# Patient Record
Sex: Female | Born: 1960 | Race: White | Hispanic: No | Marital: Married | Smoking: Never smoker
Health system: Southern US, Community
[De-identification: ages and names within clinical notes are randomized; demographics above are authoritative.]

## PROBLEM LIST (undated history)

## (undated) DIAGNOSIS — I10 Essential (primary) hypertension: Secondary | ICD-10-CM

## (undated) DIAGNOSIS — T4145XA Adverse effect of unspecified anesthetic, initial encounter: Secondary | ICD-10-CM

## (undated) DIAGNOSIS — N809 Endometriosis, unspecified: Secondary | ICD-10-CM

## (undated) DIAGNOSIS — R569 Unspecified convulsions: Secondary | ICD-10-CM

## (undated) DIAGNOSIS — G43909 Migraine, unspecified, not intractable, without status migrainosus: Secondary | ICD-10-CM

## (undated) DIAGNOSIS — T8859XA Other complications of anesthesia, initial encounter: Secondary | ICD-10-CM

## (undated) DIAGNOSIS — J329 Chronic sinusitis, unspecified: Secondary | ICD-10-CM

## (undated) HISTORY — DX: Migraine, unspecified, not intractable, without status migrainosus: G43.909

## (undated) HISTORY — PX: FOOT SURGERY: SHX648

## (undated) HISTORY — DX: Endometriosis, unspecified: N80.9

## (undated) HISTORY — PX: BACK SURGERY: SHX140

## (undated) HISTORY — PX: COLONOSCOPY: SHX174

## (undated) HISTORY — DX: Essential (primary) hypertension: I10

---

## 1986-04-02 HISTORY — PX: TUBAL LIGATION: SHX77

## 2000-04-02 HISTORY — PX: BREAST BIOPSY: SHX20

## 2001-04-02 HISTORY — PX: NASAL FRACTURE SURGERY: SHX718

## 2001-04-02 HISTORY — PX: EYE SURGERY: SHX253

## 2004-04-02 HISTORY — PX: VAGINAL HYSTERECTOMY: SUR661

## 2010-06-01 DIAGNOSIS — G43909 Migraine, unspecified, not intractable, without status migrainosus: Secondary | ICD-10-CM | POA: Insufficient documentation

## 2011-03-02 DIAGNOSIS — J329 Chronic sinusitis, unspecified: Secondary | ICD-10-CM | POA: Insufficient documentation

## 2011-05-14 ENCOUNTER — Other Ambulatory Visit: Payer: Self-pay | Admitting: Gastroenterology

## 2011-05-14 DIAGNOSIS — D134 Benign neoplasm of liver: Secondary | ICD-10-CM

## 2011-05-18 ENCOUNTER — Other Ambulatory Visit: Payer: Self-pay

## 2011-05-28 ENCOUNTER — Ambulatory Visit
Admission: RE | Admit: 2011-05-28 | Discharge: 2011-05-28 | Disposition: A | Discharge status: Home / Self Care | Payer: BC Managed Care – PPO | Source: Ambulatory Visit | Attending: Gastroenterology | Admitting: Gastroenterology

## 2011-05-28 DIAGNOSIS — D134 Benign neoplasm of liver: Secondary | ICD-10-CM

## 2011-05-28 MED ORDER — IOHEXOL 300 MG/ML  SOLN
100.0000 mL | Freq: Once | INTRAMUSCULAR | Status: AC | PRN
  Administered 2011-05-28: 100 mL via INTRAVENOUS

## 2011-06-19 DIAGNOSIS — M810 Age-related osteoporosis without current pathological fracture: Secondary | ICD-10-CM | POA: Insufficient documentation

## 2012-06-10 DIAGNOSIS — I10 Essential (primary) hypertension: Secondary | ICD-10-CM | POA: Insufficient documentation

## 2012-06-10 DIAGNOSIS — J309 Allergic rhinitis, unspecified: Secondary | ICD-10-CM | POA: Insufficient documentation

## 2012-06-10 DIAGNOSIS — R232 Flushing: Secondary | ICD-10-CM | POA: Insufficient documentation

## 2012-07-08 DIAGNOSIS — Z8639 Personal history of other endocrine, nutritional and metabolic disease: Secondary | ICD-10-CM | POA: Insufficient documentation

## 2013-04-27 ENCOUNTER — Encounter: Payer: Self-pay | Admitting: Certified Nurse Midwife

## 2013-04-29 ENCOUNTER — Encounter: Payer: Self-pay | Admitting: Certified Nurse Midwife

## 2013-04-29 ENCOUNTER — Ambulatory Visit (INDEPENDENT_AMBULATORY_CARE_PROVIDER_SITE_OTHER): Payer: PRIVATE HEALTH INSURANCE | Admitting: Certified Nurse Midwife

## 2013-04-29 VITALS — BP 122/82 | HR 74 | Resp 20 | Ht 65.75 in | Wt 160.0 lb

## 2013-04-29 DIAGNOSIS — N8111 Cystocele, midline: Secondary | ICD-10-CM

## 2013-04-29 DIAGNOSIS — E041 Nontoxic single thyroid nodule: Secondary | ICD-10-CM

## 2013-04-29 DIAGNOSIS — Z01419 Encounter for gynecological examination (general) (routine) without abnormal findings: Secondary | ICD-10-CM

## 2013-04-29 DIAGNOSIS — IMO0001 Reserved for inherently not codable concepts without codable children: Secondary | ICD-10-CM

## 2013-04-29 DIAGNOSIS — Z0000 Encounter for general adult medical examination without abnormal findings: Secondary | ICD-10-CM

## 2013-04-29 DIAGNOSIS — IMO0002 Reserved for concepts with insufficient information to code with codable children: Secondary | ICD-10-CM

## 2013-04-29 LAB — POCT URINALYSIS DIPSTICK
Bilirubin, UA: NEGATIVE
Blood, UA: NEGATIVE
GLUCOSE UA: NEGATIVE
Ketones, UA: NEGATIVE
Leukocytes, UA: NEGATIVE
NITRITE UA: NEGATIVE
Protein, UA: NEGATIVE
UROBILINOGEN UA: NEGATIVE
pH, UA: 5

## 2013-04-29 LAB — HEMOGLOBIN, FINGERSTICK: HEMOGLOBIN, FINGERSTICK: 13.9 g/dL (ref 12.0–16.0)
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
## 2013-04-29 NOTE — Patient Instructions (Signed)

## 2013-04-29 NOTE — Progress Notes (Signed)
Reviewed personally.  M. Suzanne Addi Pak, MD.  

## 2013-04-29 NOTE — Progress Notes (Signed)
Patient scheduled for Thyroid U/S at [place] for 05/04/13 at 2:30. Patient aware. Agreeable to time/date/location. Imaging hold placed.

## 2013-04-30 ENCOUNTER — Telehealth: Payer: Self-pay | Admitting: Emergency Medicine

## 2013-04-30 LAB — THYROID PANEL WITH TSH
Free Thyroxine Index: 2.7 (ref 1.0–3.9)
T3 Uptake: 30.5 % (ref 22.5–37.0)
T4, Total: 8.8 ug/dL (ref 5.0–12.5)
TSH: 0.763 u[IU]/mL (ref 0.350–4.500)

## 2013-04-30 NOTE — Telephone Encounter (Signed)
Patient aware of Thryoid u/s Appointment scheduled for 1/30 at 1:45 at [facility].

## 2013-05-01 ENCOUNTER — Ambulatory Visit
Admission: RE | Admit: 2013-05-01 | Discharge: 2013-05-01 | Disposition: A | Discharge status: Home / Self Care | Payer: PRIVATE HEALTH INSURANCE | Source: Ambulatory Visit | Attending: Certified Nurse Midwife | Admitting: Certified Nurse Midwife

## 2013-05-01 ENCOUNTER — Other Ambulatory Visit: Payer: Self-pay | Admitting: Certified Nurse Midwife

## 2013-05-01 DIAGNOSIS — E041 Nontoxic single thyroid nodule: Secondary | ICD-10-CM

## 2013-05-04 ENCOUNTER — Encounter: Payer: Self-pay | Admitting: Certified Nurse Midwife

## 2013-05-05 ENCOUNTER — Telehealth: Payer: Self-pay | Admitting: Emergency Medicine

## 2013-05-05 DIAGNOSIS — E041 Nontoxic single thyroid nodule: Secondary | ICD-10-CM

## 2013-05-05 NOTE — Telephone Encounter (Signed)
Patient returning Tracy's call. °

## 2013-05-05 NOTE — Telephone Encounter (Signed)
Spoke with patient and message from Regina Eck CNM given. Patient agreeable to plan for follow up. Advised referral pending and would contact her when received appointment information.  Patient agreeable.

## 2013-05-05 NOTE — Telephone Encounter (Signed)
Message copied by Michele Mcalpine on Tue May 05, 2013  2:09 PM ------      Message from: Regina Eck      Created: Fri May 01, 2013  5:15 PM       Notify patient that thyroid US showed multiple small and the large nodules felt in the office.      Per finding recommendation is to be seen by Endocrinologist who specialize in this. Patient may need biopsy.      Please refer to Endocrine ------

## 2013-05-06 NOTE — Telephone Encounter (Signed)
Returning a call to Tracy °

## 2013-05-06 NOTE — Telephone Encounter (Addendum)
Given appointment for Dr. Buddy Duty for 08/03/13 at 2:00. Send message to Regina Eck CNM to confirm that this will be okay, patient will need earlier appointment. New referral entered for endocrinology at [facility]. Will attempt to get earlier appointment.  Advised patient of above and advised new referral placed. Will call her with earlier appointment.

## 2013-05-08 ENCOUNTER — Other Ambulatory Visit (HOSPITAL_COMMUNITY)
Admission: RE | Admit: 2013-05-08 | Discharge: 2013-05-08 | Disposition: A | Discharge status: Home / Self Care | Payer: PRIVATE HEALTH INSURANCE | Source: Ambulatory Visit | Attending: Endocrinology | Admitting: Endocrinology

## 2013-05-08 ENCOUNTER — Ambulatory Visit (INDEPENDENT_AMBULATORY_CARE_PROVIDER_SITE_OTHER): Payer: PRIVATE HEALTH INSURANCE | Admitting: Endocrinology

## 2013-05-08 ENCOUNTER — Encounter: Payer: Self-pay | Admitting: Endocrinology

## 2013-05-08 VITALS — BP 126/90 | HR 82 | Temp 98.0°F | Ht 65.75 in | Wt 164.0 lb

## 2013-05-08 DIAGNOSIS — E041 Nontoxic single thyroid nodule: Secondary | ICD-10-CM | POA: Insufficient documentation

## 2013-05-08 DIAGNOSIS — E042 Nontoxic multinodular goiter: Secondary | ICD-10-CM

## 2013-05-08 NOTE — Patient Instructions (Signed)
i'll notify you of the biopsy results via mychart. If no cancer is seen, please come back for a follow-up appointment in 6-12 months
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
## 2013-05-11 NOTE — Telephone Encounter (Signed)
Thank you :)

## 2013-05-11 NOTE — Telephone Encounter (Signed)
Called and cancelled appointment with Dr. Buddy Duty with [facility] at this time.  Patient has been seen at [facility].

## 2013-05-25 DIAGNOSIS — E041 Nontoxic single thyroid nodule: Secondary | ICD-10-CM | POA: Insufficient documentation

## 2014-02-01 ENCOUNTER — Encounter: Payer: Self-pay | Admitting: Endocrinology

## 2014-03-31 ENCOUNTER — Encounter: Payer: Self-pay | Admitting: Certified Nurse Midwife

## 2014-05-05 ENCOUNTER — Ambulatory Visit (INDEPENDENT_AMBULATORY_CARE_PROVIDER_SITE_OTHER): Payer: PRIVATE HEALTH INSURANCE | Admitting: Certified Nurse Midwife

## 2014-05-05 ENCOUNTER — Encounter: Payer: Self-pay | Admitting: Certified Nurse Midwife

## 2014-05-05 VITALS — BP 110/70 | HR 70 | Resp 16 | Ht 65.5 in | Wt 145.0 lb

## 2014-05-05 DIAGNOSIS — Z0000 Encounter for general adult medical examination without abnormal findings: Secondary | ICD-10-CM

## 2014-05-05 DIAGNOSIS — Z01419 Encounter for gynecological examination (general) (routine) without abnormal findings: Secondary | ICD-10-CM

## 2014-05-05 DIAGNOSIS — IMO0001 Reserved for inherently not codable concepts without codable children: Secondary | ICD-10-CM

## 2014-05-05 DIAGNOSIS — IMO0002 Reserved for concepts with insufficient information to code with codable children: Secondary | ICD-10-CM

## 2014-05-05 DIAGNOSIS — N8110 Cystocele, unspecified: Secondary | ICD-10-CM

## 2014-05-05 LAB — POCT URINALYSIS DIPSTICK
BILIRUBIN UA: NEGATIVE
Blood, UA: NEGATIVE
Glucose, UA: NEGATIVE
Ketones, UA: NEGATIVE
Leukocytes, UA: NEGATIVE
Nitrite, UA: NEGATIVE
PH UA: 8
PROTEIN UA: NEGATIVE
Urobilinogen, UA: NEGATIVE

## 2014-05-05 NOTE — Progress Notes (Signed)
Reviewed personally.  M. Suzanne Nirvan Laban, MD.  
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
## 2014-05-05 NOTE — Patient Instructions (Addendum)
EXERCISE AND DIET:  We recommended that you start or continue a regular exercise program for good health. Regular exercise means any activity that makes your heart beat faster and makes you sweat.  We recommend exercising at least 30 minutes per day at least 3 days a week, preferably 4 or 5.  We also recommend a diet low in fat and sugar.  Inactivity, poor dietary choices and obesity can cause diabetes, heart attack, stroke, and kidney damage, among others.    ALCOHOL AND SMOKING:  Women should limit their alcohol intake to no more than 7 drinks/beers/glasses of wine (combined, not each!) per week. Moderation of alcohol intake to this level decreases your risk of breast cancer and liver damage. And of course, no recreational drugs are part of a healthy lifestyle.  And absolutely no smoking or even second hand smoke. Most people know smoking can cause heart and lung diseases, but did you know it also contributes to weakening of your bones? Aging of your skin?  Yellowing of your teeth and nails?  CALCIUM AND VITAMIN D:  Adequate intake of calcium and Vitamin D are recommended.  The recommendations for exact amounts of these supplements seem to change often, but generally speaking 600 mg of calcium (either carbonate or citrate) and 800 units of Vitamin D per day seems prudent. Certain women may benefit from higher intake of Vitamin D.  If you are among these women, your doctor will have told you during your visit.    PAP SMEARS:  Pap smears, to check for cervical cancer or precancers,  have traditionally been done yearly, although recent scientific advances have shown that most women can have pap smears less often.  However, every woman still should have a physical exam from her gynecologist every year. It will include a breast check, inspection of the vulva and vagina to check for abnormal growths or skin changes, a visual exam of the cervix, and then an exam to evaluate the size and shape of the uterus and  ovaries.  And after 56 years of age, a rectal exam is indicated to check for rectal cancers. We will also provide age appropriate advice regarding health maintenance, like when you should have certain vaccines, screening for sexually transmitted diseases, bone density testing, colonoscopy, mammograms, etc.   MAMMOGRAMS:  All women over 40 years old should have a yearly mammogram. Many facilities now offer a "3D" mammogram, which may cost around $50 extra out of pocket. If possible,  we recommend you accept the option to have the 3D mammogram performed.  It both reduces the number of women who will be called back for extra views which then turn out to be normal, and it is better than the routine mammogram at detecting truly abnormal areas.    COLONOSCOPY:  Colonoscopy to screen for colon cancer is recommended for all women at age 50.  We know, you hate the idea of the prep.  We agree, BUT, having colon cancer and not knowing it is worse!!  Colon cancer so often starts as a polyp that can be seen and removed at colonscopy, which can quite literally save your life!  And if your first colonoscopy is normal and you have no family history of colon cancer, most women don't have to have it again for 10 years.  Once every ten years, you can do something that may end up saving your life, right?  We will be happy to help you get it scheduled when you are ready.    Be sure to check your insurance coverage so you understand how much it will cost.  It may be covered as a preventative service at no cost, but you should check your particular policy.     About Cystocele  Overview  The pelvic organs, including the bladder, are normally supported by pelvic floor muscles and ligaments.  When these muscles and ligaments are stretched, weakened or torn, the wall between the bladder and the vagina sags or herniates causing a prolapse, sometimes called a cystocele.  This condition may cause discomfort and problems with emptying  the bladder.  It can be present in various stages.  Some people are not aware of the changes.  Others may notice changes at the vaginal opening or a feeling of the bladder dropping outside the body.  Causes of a Cystocele  A cystocele is usually caused by muscle straining or stretching during childbirth.  In addition, cystocele is more common after menopause, because the hormone estrogen helps keep the elastic tissues around the pelvic organs strong.  A cystocele is more likely to occur when levels of estrogen decrease.  Other causes include: heavy lifting, chronic coughing, previous pelvic surgery and obesity.  Symptoms  A bladder that has dropped from its normal position may cause: unwanted urine leakage (stress incontinence), frequent urination or urge to urinate, incomplete emptying of the bladder (not feeling bladder relief after emptying), pain or discomfort in the vagina, pelvis, groin, lower back or lower abdomen and frequent urinary tract infections.  Mild cases may not cause any symptoms.  Treatment Options  Pelvic floor (Kegel) exercises:  Strength training the muscles in your genital area  Behavioral changes: Treating and preventing constipation, taking time to empty your bladder properly, learning to lift properly and/or avoid heavy lifting when possible, stopping smoking, avoiding weight gain and treating a chronic cough or bronchitis.  A pessary: A vaginal support device is sometimes used to help pelvic support caused by muscle and ligament changes.  Surgery: Surgical repair may be necessary if symptoms cannot be managed with exercise, behavioral changes and a pessary.  Surgery is usually considered for severe cases.   2007, [employer] 

## 2014-09-20 ENCOUNTER — Encounter: Payer: Self-pay | Admitting: Podiatry

## 2014-09-20 ENCOUNTER — Ambulatory Visit (INDEPENDENT_AMBULATORY_CARE_PROVIDER_SITE_OTHER): Payer: PRIVATE HEALTH INSURANCE | Admitting: Podiatry

## 2014-09-20 ENCOUNTER — Ambulatory Visit: Payer: PRIVATE HEALTH INSURANCE

## 2014-09-20 VITALS — BP 149/96 | HR 74 | Resp 12

## 2014-09-20 DIAGNOSIS — M722 Plantar fascial fibromatosis: Secondary | ICD-10-CM | POA: Diagnosis not present

## 2014-09-20 DIAGNOSIS — R52 Pain, unspecified: Secondary | ICD-10-CM | POA: Diagnosis not present

## 2014-09-20 DIAGNOSIS — M629 Disorder of muscle, unspecified: Secondary | ICD-10-CM | POA: Diagnosis not present

## 2014-09-20 NOTE — Progress Notes (Signed)
   Subjective:    Patient ID: Sarah Fowler, female    DOB: 09/06/1960, 54 y.o.   MRN: [id number]  HPI 20 shell female presents the office today with complaints of severe right heel pain which has been ongoing for several months. She states that she appears to be treated for plantar fasciitis and she's had 3 steroid injections to the area which did not help. She is also been wearing a day/night splint which do not seem to help either. She states that she has pain which she puts pressure to her heel. She states that she has pain to her heel throughout the day. When the pain first started seeing that it was more in the morning however his progressed. She also states that she previously she had a fracture to her heel. She denies any numbness or tingling. Denies any swelling or redness. The pain does not wake her up at night. No other complaints this time.  Review of Systems  HENT: Positive for sinus pressure.   Musculoskeletal: Positive for myalgias.       Objective:   Physical Exam AAO x3, NAD DP/PT pulses palpable bilaterally, CRT less than 3 seconds Protective sensation intact with Simms Weinstein monofilament, vibratory sensation intact, Achilles tendon reflex intact Tenderness to palpation overlying the plantar medial tubercle of the calcaneus to right heel at the insertion of the plantar fascia. There is pain along the course of plantar fascial within the arch of the foot. Upon dorsiflexion of the hallux the plantar fascial does not appear to be as defined as the contralateral extremity. There is no pain with lateral compression of the calcaneus or pain the vibratory sensation. No pain on the posterior aspect of the calcaneus or along the course/insertion of the Achilles tendon. There is no overlying edema, erythema, increase in warmth. No other areas of tenderness palpation or pain with vibratory sensation to the foot/ankle. MMT 5/5, ROM WNL No open lesions or pre-ulcerative lesions are  identified. No pain with calf compression, swelling, warmth, erythema.     Assessment & Plan:  54 year-old female with significant right heel pain -X-rays were obtained and reviewed with the patient.  -Treatment options discussed including all alternatives, risks, and complications -As the patient has attempted multiple conservative treatments including injections, ice, bracing, shoe changes, orthotics and as the plantar fascia does not appear to be ossifying this compared to contralateral extremity will obtain MRI to further evaluate the plantar fascial in the integrity of the ligament. Also she states that she has a history of a fracture to the heel although this is not evident on x-ray. -Discussed surgical intervention and possible physical therapy however we'll await the results of the MRI before proceeding. -For now continue with ice, should shoe gear modifications, bracing. -Follow-up after MRI or sooner if any problems are to arise. In the meantime call the office with any questions, concerns, changes symptoms.

## 2014-09-21 ENCOUNTER — Telehealth: Payer: Self-pay | Admitting: *Deleted

## 2014-09-21 NOTE — Telephone Encounter (Signed)
Spoke with Sarah Fowler 09/21/2014 at [facility] stated no prior authorization was needed for outpt MRI 83475 right foot without contrast.  Contacted pt at work phone and informed of pt of insurance information.  Pt states she is scheduled for 09/22/2014 and Dr. Jacqualyn Posey stated he wanted to see her after the MRI was performed.  Transferred pt to schedulers.

## 2014-09-22 ENCOUNTER — Ambulatory Visit
Admission: RE | Admit: 2014-09-22 | Discharge: 2014-09-22 | Disposition: A | Discharge status: Home / Self Care | Payer: 59 | Source: Ambulatory Visit | Attending: Podiatry | Admitting: Podiatry

## 2014-09-22 DIAGNOSIS — M629 Disorder of muscle, unspecified: Secondary | ICD-10-CM

## 2014-10-01 ENCOUNTER — Ambulatory Visit (INDEPENDENT_AMBULATORY_CARE_PROVIDER_SITE_OTHER): Payer: PRIVATE HEALTH INSURANCE | Admitting: Podiatry

## 2014-10-01 ENCOUNTER — Encounter: Payer: Self-pay | Admitting: Podiatry

## 2014-10-01 VITALS — BP 143/85 | HR 68 | Resp 14

## 2014-10-01 DIAGNOSIS — M722 Plantar fascial fibromatosis: Secondary | ICD-10-CM | POA: Diagnosis not present

## 2014-10-01 NOTE — Patient Instructions (Signed)
Pre-Operative Instructions  Congratulations, you have decided to take an important step to improving your quality of life.  You can be assured that the doctors of [facility] will be with you every step of the way.  1. Plan to be at the surgery center/hospital at least 1 (one) hour prior to your scheduled time unless otherwise directed by the surgical center/hospital staff.  You must have a responsible adult accompany you, remain during the surgery and drive you home.  Make sure you have directions to the surgical center/hospital and know how to get there on time. 2. For hospital based surgery you will need to obtain a history and physical form from your family physician within 1 month prior to the date of surgery- we will give you a form for you primary physician.  3. We make every effort to accommodate the date you request for surgery.  There are however, times where surgery dates or times have to be moved.  We will contact you as soon as possible if a change in schedule is required.   4. No Aspirin/Ibuprofen for one week before surgery.  If you are on aspirin, any non-steroidal anti-inflammatory medications (Mobic, Aleve, Ibuprofen) you should stop taking it 7 days prior to your surgery.  You make take Tylenol  For pain prior to surgery.  5. Medications- If you are taking daily heart and blood pressure medications, seizure, reflux, allergy, asthma, anxiety, pain or diabetes medications, make sure the surgery center/hospital is aware before the day of surgery so they may notify you which medications to take or avoid the day of surgery. 6. No food or drink after midnight the night before surgery unless directed otherwise by surgical center/hospital staff. 7. No alcoholic beverages 24 hours prior to surgery.  No smoking 24 hours prior to or 24 hours after surgery. 8. Wear loose pants or shorts- loose enough to fit over bandages, boots, and casts. 9. No slip on shoes, sneakers are best. 10. Bring  your boot with you to the surgery center/hospital.  Also bring crutches or a walker if your physician has prescribed it for you.  If you do not have this equipment, it will be provided for you after surgery. 11. If you have not been contracted by the surgery center/hospital by the day before your surgery, call to confirm the date and time of your surgery. 12. Leave-time from work may vary depending on the type of surgery you have.  Appropriate arrangements should be made prior to surgery with your employer. 13. Prescriptions will be provided immediately following surgery by your doctor.  Have these filled as soon as possible after surgery and take the medication as directed. 14. Remove nail polish on the operative foot. 15. Wash the night before surgery.  The night before surgery wash the foot and leg well with the antibacterial soap provided and water paying special attention to beneath the toenails and in between the toes.  Rinse thoroughly with water and dry well with a towel.  Perform this wash unless told not to do so by your physician.  Enclosed: 1 Ice pack (please put in freezer the night before surgery)   1 Hibiclens skin cleaner   Pre-op Instructions  If you have any questions regarding the instructions, do not hesitate to call our office.  [place]: [address] [place], [place] [address] [phone]  [place]: [address] [place], [place] [address] [phone]  [place]: [address]  [place], [place] [address] [phone]  Dr. Richard   Tuchman DPM, Dr. Norman Regal DPM Dr. Richard Sikora DPM, Dr. M. Todd Hyatt DPM, Dr. Kathryn Egerton DPM, Dr. Dorean Daniello DPM 

## 2014-10-05 ENCOUNTER — Telehealth: Payer: Self-pay | Admitting: *Deleted

## 2014-10-05 NOTE — Telephone Encounter (Signed)
I called patient to see if she could move her surgery from 10/12/2014 to October 19, 2104.  Dr. Leigh Aurora schedule is already full for 10/12/2014.  "That will be fine.  It'll give me another week to work.  What time will it be?"  Surgical center will call you a day or 2 prior to your surgery date and give you that information.  "Okay, thank you."

## 2014-10-07 DIAGNOSIS — M79673 Pain in unspecified foot: Secondary | ICD-10-CM

## 2014-10-10 NOTE — Progress Notes (Signed)
Patient ID: Sarah Fowler, female   DOB: 07/02/1960, 54 y.o.   MRN: [id number]  Subjective: 54 year old female presents the office they for follow-up evaluation of right heel pain and to discuss MRI results. She states that she continues to have pain to her right heel in the mornings or after periods of activity which is on her feet for long periods of time. She denies any numbness or tingling. The pain does not wake her up at night. She denies any history of injury or trauma. Denies any swelling or redness. She's had muscle conservative treatments including shoe gear modifications, inserts, steroid injections, anti-inflammatories without any relief of symptoms. At this time she is requesting surgical intervention to help decrease her pain. No other complaints at this time in no acute changes since last appointment.  Objective: AAO x3, NAD DP/PT pulses palpable bilaterally, CRT less than 3 seconds Protective sensation intact with Simms Weinstein monofilament, vibratory sensation intact, Achilles tendon reflex intact; negative tinel sign There is continued tenderness to palpation overlying the plantar medial tubercle of the calcaneus to right heel at the insertion of the plantar fascia. There is no pain along the course of plantar fascia within the arch of the foot. There is no pain with lateral compression of the calcaneus or pain the vibratory sensation. No pain on the posterior aspect of the calcaneus or along the course/insertion of the Achilles tendon. There is no overlying edema, erythema, increase in warmth. No other areas of tenderness palpation or pain with vibratory sensation to the foot/ankle. MMT 5/5, ROM WNL No open lesions or pre-ulcerative lesions are identified. No pain with calf compression, swelling, warmth, erythema.  Assessment: 54 year old female with right foot plantar fasciitis.  Plan: -Treatment options discussed including all alternatives, risks, and complications -MRI results  were discussed the patient -At this time as she is attended conservative treatment without any relief of symptoms I discussed with her surgical intervention physical therapy. At this time she is requesting surgical intervention. I discussed with her right foot plantar fasciotomy with heel spur resection and PRP. -The incision placement as well as the postoperative course was discussed with the patient. I discussed risks of the surgery which include, but not limited to, infection, bleeding, pain, swelling, need for further surgery, delayed or nonhealing, painful or ugly scar, numbness or sensation changes, over/under correction, recurrence, transfer lesions, further deformity, hardware failure, DVT/PE, loss of toe/foot. Patient understands these risks and wishes to proceed with surgery. The surgical consent was reviewed with the patient all 3 pages were signed. No promises or guarantees were given to the outcome of the procedure. All questions were answered to the best of my ability. Before the surgery the patient was encouraged to call the office if there is any further questions. The surgery will be performed at the [facility] on an outpatient basis.  Celesta Gentile, DPM

## 2014-10-20 ENCOUNTER — Encounter: Payer: Self-pay | Admitting: Podiatry

## 2014-10-20 DIAGNOSIS — M7730 Calcaneal spur, unspecified foot: Secondary | ICD-10-CM | POA: Diagnosis not present

## 2014-10-20 DIAGNOSIS — M722 Plantar fascial fibromatosis: Secondary | ICD-10-CM | POA: Diagnosis not present

## 2014-10-21 NOTE — Progress Notes (Signed)
DOS 10/20/2014 Right plantar fasciotomy, right heel spur resection (inferior) platelet rich plasma.

## 2014-10-21 NOTE — Progress Notes (Signed)
It is platelet RICH plasma.

## 2014-10-25 ENCOUNTER — Ambulatory Visit (INDEPENDENT_AMBULATORY_CARE_PROVIDER_SITE_OTHER): Payer: PRIVATE HEALTH INSURANCE | Admitting: Podiatry

## 2014-10-25 ENCOUNTER — Ambulatory Visit (INDEPENDENT_AMBULATORY_CARE_PROVIDER_SITE_OTHER): Payer: PRIVATE HEALTH INSURANCE

## 2014-10-25 ENCOUNTER — Encounter: Payer: Self-pay | Admitting: Podiatry

## 2014-10-25 VITALS — BP 142/81 | HR 82 | Resp 15

## 2014-10-25 DIAGNOSIS — Z9889 Other specified postprocedural states: Secondary | ICD-10-CM | POA: Diagnosis not present

## 2014-10-25 DIAGNOSIS — M722 Plantar fascial fibromatosis: Secondary | ICD-10-CM

## 2014-10-25 MED ORDER — HYDROCODONE-ACETAMINOPHEN 10-325 MG PO TABS: 1.0000 | ORAL_TABLET | ORAL | Status: DC | PRN

## 2014-10-25 NOTE — Progress Notes (Signed)
Patient ID: Sarah Fowler, female   DOB: 01-12-1961, 54 y.o.   MRN: [id number]  DOS: 10/20/14  Subjective: 54 year old female presents the office today one-week status post right foot heel spur resection, plantar fasciotomy, PRP injection. She states that overall she is doing well although she does continue to have pain which is relieved by the pain medication. She's been taking antibiotic as directed. She does of the Percocet is making her itch. She is continuing the cam walker and crutches. She states that she is tried to stay off as much as possible although she does get up on it and she did slip the other today. Denies any systemic complaints as fevers, chills, nausea, vomiting. Denies any calf pain, chest pain, since of breath. No other complaints at this time in no acute changes.  Objective: AAO 3, NAD DP/PT pulses palpable, CRT less than 3 seconds Neurological status intact. There is no subjective numbness or tingling. Incision the medial aspect of the barefoot as well coapted without any evidence of dehiscence and sutures are intact. There is no swelling erythema, ascending cellulitis, fluctuance, crepitus, drainage, malodor. No clinical signs of infection this time. There is tenderness to palpation overlying the surgical site with mild edema. No other areas of tenderness to bilateral lower extremity is. No other areas of edema, erythema, increase in warmth. No open lesions or pre-ulcerative lesions. No pain with calf compression, swelling, warmth, erythema.  Assessment: 54 year old female 1 week status post right foot surgery  Plan: -X-rays were obtained and reviewed with the patient.  -Treatment options discussed including all alternatives, risks, and complications -Antibiotic ointment was applied over the incision followed by dry sterile dressing. Keep dressing clean, dry, intact. -Weightbearing as tolerated in cam boot. -Ice/elevation -Pain medication as needed. Rx Vicodin  -Monitor  for any clinical signs or symptoms of infection and directed to call the office immediately should any occur or go to the ER. -Follow-up 1 week for suture removal or sooner if any problems arise. In the meantime, encouraged to call the office with any questions, concerns, change in symptoms.   Celesta Gentile, DPM

## 2014-11-01 ENCOUNTER — Encounter: Payer: Self-pay | Admitting: Podiatry

## 2014-11-01 ENCOUNTER — Ambulatory Visit (INDEPENDENT_AMBULATORY_CARE_PROVIDER_SITE_OTHER): Payer: PRIVATE HEALTH INSURANCE

## 2014-11-01 ENCOUNTER — Ambulatory Visit (INDEPENDENT_AMBULATORY_CARE_PROVIDER_SITE_OTHER): Payer: PRIVATE HEALTH INSURANCE | Admitting: Podiatry

## 2014-11-01 VITALS — BP 136/91 | HR 82 | Resp 12

## 2014-11-01 DIAGNOSIS — Z9889 Other specified postprocedural states: Secondary | ICD-10-CM

## 2014-11-01 DIAGNOSIS — M722 Plantar fascial fibromatosis: Secondary | ICD-10-CM

## 2014-11-01 NOTE — Progress Notes (Signed)
Patient ID: Kathlene Yano, female   DOB: 10-Jan-1961, 54 y.o.   MRN: [id number]  DOS: 10/20/14  Subjective: 54 year old female presents the office today 2-weeks status post right foot heel spur resection, plantar fasciotomy, PRP injection. She said that overall she is to monitor pain is better controlled. She has decreasing pain medicines she has been taking. She is continuing to wear the CAM walker. Denies any systemic complaints as fevers, chills, nausea, vomiting. Denies any calf pain, chest pain, since of breath. No other complaints at this time in no acute changes.  Objective: AAO 3, NAD DP/PT pulses palpable, CRT less than 3 seconds Neurological status intact. There is no subjective numbness or tingling. Incision the medial aspect of the barefoot as well coapted without any evidence of dehiscence and sutures are intact. There is no surrounding erythema, ascending cellulitis, fluctuance, crepitus, drainage, malodor. No clinical signs of infection this time. There is decreased tenderness to palpation overlying the surgical site with mild edema. Mild discomfort along the plantar aspect of the foot on the surgical site. No other areas of tenderness to bilateral lower extremities. No other areas of edema, erythema, increase in warmth. No open lesions or pre-ulcerative lesions. No pain with calf compression, swelling, warmth, erythema.  Assessment: 54 year old female 2 weeks status post right foot surgery  Plan: -Treatment options discussed including all alternatives, risks, and complications -Sutures removed without complications. Antibiotic ointment was applied over the incision followed by dry sterile dressing. Keep dressing clean, dry, intact. She did with a dressing 24 hours since her to shower as long the incision remains coapted. There is any problem the incision to hold off on showering call the office. -Weightbearing as tolerated in cam boot. She can transition to a regular sneaker as  tolerated. -She must wear the CAM walker with a night splint every night while sleeping. -Ice/elevation -Pain medication as needed.   -Monitor for any clinical signs or symptoms of infection and directed to call the office immediately should any occur or go to the ER. -Follow-up 2 weeks for suture removal or sooner if any problems arise. In the meantime, encouraged to call the office with any questions, concerns, change in symptoms.   Celesta Gentile, DPM

## 2014-11-08 ENCOUNTER — Telehealth: Payer: Self-pay | Admitting: Podiatry

## 2014-11-08 NOTE — Telephone Encounter (Signed)
PT CALLED AND STATES SHE HAS AN APPT ON 8.15 TO FOLLOW UP BUT HAS A HARD KNOT ON HEEL OF FOOT. SHOULD SHE COME IN SOONER?

## 2014-11-15 ENCOUNTER — Ambulatory Visit (INDEPENDENT_AMBULATORY_CARE_PROVIDER_SITE_OTHER): Payer: PRIVATE HEALTH INSURANCE | Admitting: Podiatry

## 2014-11-15 ENCOUNTER — Encounter: Payer: Self-pay | Admitting: Podiatry

## 2014-11-15 VITALS — BP 142/91 | HR 84 | Resp 18

## 2014-11-15 DIAGNOSIS — Z9889 Other specified postprocedural states: Secondary | ICD-10-CM

## 2014-11-15 DIAGNOSIS — M722 Plantar fascial fibromatosis: Secondary | ICD-10-CM

## 2014-11-15 MED ORDER — CEPHALEXIN 500 MG PO CAPS: 500.0000 mg | ORAL_CAPSULE | Freq: Three times a day (TID) | ORAL | Status: DC

## 2014-11-15 NOTE — Progress Notes (Signed)
Patient ID: Sarah Fowler, female   DOB: 18-Mar-1961, 54 y.o.   MRN: [id number]  DOS: 10/20/14 s/p plantar fascia release  Subjective: 54 year old female presents the office today status post right foot heel resection, plantar fasciotomy, PRP. She states that since last appointment she is felt to not perform on the bottom of her heel which is not present after the surgery. She also states that she notices small out of yellow drainage coming from the incision however she denies any pus. She states that she has pain to the bottom of her foot. She stated that she is unable to wear tennis shoe at this time as she remains in the cam walker. She did state that when he tried into the shoe she was wearing a hard orthotic however she did take orthotic out and wear regular insert which seem to help. She denies any systemic complaints as fevers, chills, nausea, vomiting. She denies any calf pain, chest pain, shortness of breath. No other complaints at this time in no acute changes his last appointment.  Objective: AAO 3, NAD Neurovascular status unchanged Incision the medial aspect of the right heels coapted without any evidence of dehiscence. There is a small amount of macerated tissue around the incision. There is no drainage or purulence expressed. There is a faint amount of erythema around the incision have there is no ascending cellulitis at this time. There is no areas of fluctuance or crepitus. There is no malodor. There is tenderness palpation over the medial aspect of the heel over surgical site. There is some subjective numbness between the first and second toes on the dorsal aspect of the foot. There is no subjective numbness or tingling to the plantar aspect of the foot. On the plantar aspect of the heel about surgical site there is a small firm area however there is no specific nodule identified. No other open lesions or pre-ulcerative lesions identified. There is no pain with calf compression, swelling,  warmth, erythema.  Assessment: 54 year old female status post right foot surgery  Plan: -Treatment options discussed including all alternatives, risks, and complications -Continued antibiotic ointment and a Band-Aid over the incision daily. -In case of infection we'll start Keflex. Monitor for any clinical signs or symptoms of infection and directed to call the office immediately should any occur or go to the ER. -Wear Cam Gilford Rile is needed. She can start to transition to a regular shoe as tolerated. Muscle or Cam Walker at night splint at night. -Pain medication as needed. -Compression stocking. -Follow-up 2 weeks or sooner if any problems arise. In the meantime, encouraged to call the office with any questions, concerns, change in symptoms.   Celesta Gentile, DPM

## 2014-11-29 ENCOUNTER — Encounter: Payer: Self-pay | Admitting: Podiatry

## 2014-11-29 ENCOUNTER — Ambulatory Visit (INDEPENDENT_AMBULATORY_CARE_PROVIDER_SITE_OTHER): Payer: PRIVATE HEALTH INSURANCE | Admitting: Podiatry

## 2014-11-29 VITALS — BP 139/82 | HR 95 | Resp 18

## 2014-11-29 DIAGNOSIS — Z9889 Other specified postprocedural states: Secondary | ICD-10-CM

## 2014-11-29 DIAGNOSIS — M722 Plantar fascial fibromatosis: Secondary | ICD-10-CM
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
## 2014-12-27 ENCOUNTER — Encounter: Payer: Self-pay | Admitting: Podiatry

## 2014-12-27 ENCOUNTER — Ambulatory Visit (INDEPENDENT_AMBULATORY_CARE_PROVIDER_SITE_OTHER): Payer: PRIVATE HEALTH INSURANCE | Admitting: Podiatry

## 2014-12-27 VITALS — BP 126/74 | HR 78 | Resp 18

## 2014-12-27 DIAGNOSIS — M722 Plantar fascial fibromatosis: Secondary | ICD-10-CM

## 2014-12-27 DIAGNOSIS — Z9889 Other specified postprocedural states: Secondary | ICD-10-CM

## 2014-12-27 NOTE — Progress Notes (Signed)
Patient ID: Sarah Fowler, female   DOB: [birth year], 54 y.o.   MRN: [id number]  DOS: 10/20/14 s/p plantar fascia release  Subjective: 54 year old female presents the office today status post right foot heel spur resection, plantar fasciotomy, PRP. She states that since last appointment she's had significant improvement in her pain. She gets some mild discomfort on the incision over the pain over the heels much improved. She gets some discomfort when it rains. She says that the knot has resolved. She's been wearing the cam boot or night splint at night. She is able to wear regular shoe without any palpitations. She denies any systemic complaints as fevers, chills, nausea, vomiting. She denies any calf pain, chest pain, shortness of breath. No other complaints at this time in no acute changes his last appointment.  Objective: AAO 3, NAD; presents in a regular shoe.  Neurovascular status unchanged Incision the medial aspect of the right heel remains coapted without any evidence of dehiscence. There is mild to palpation overlying the incision however does appear to be improved. There is trace edema around the surgical site without any associated erythema or increased warmth. There is mild discomfort to palpation along the surgical site on the medial incision however there is no pain on the plantar aspect of the heel on the plantar fascial surgery site. There is no pain on the course of the plantar fascia. No other open lesions or pre-ulcerative lesions identified.  There is no pain with calf compression, swelling, warmth, erythema.  Assessment: 54 year old female status post right foot surgery, doing well.   Plan: -Treatment options discussed including all alternatives, risks, and complications -Continue to use vitamin E cream or cocoa butter on the scar to help with the incision. -Continue ankle compression stocking -A to wear the cam boot at night splint at night every night she conservative back  off on this over the next couple weeks as long as she has no pain to her heel. -Continue supportive shoe gear. -Pain medication as needed -Follow-up  if symptoms are not completely resolved within the next 4 weeks sooner if any problems arise. In the meantime, encouraged to call the office with any questions, concerns, change in symptoms.   Celesta Gentile, DPM

## 2015-01-05 DIAGNOSIS — K635 Polyp of colon: Secondary | ICD-10-CM | POA: Insufficient documentation

## 2015-02-11 DIAGNOSIS — M479 Spondylosis, unspecified: Secondary | ICD-10-CM | POA: Insufficient documentation

## 2015-02-14 ENCOUNTER — Emergency Department (HOSPITAL_COMMUNITY): Payer: 59

## 2015-02-14 ENCOUNTER — Encounter (HOSPITAL_COMMUNITY): Payer: Self-pay

## 2015-02-14 ENCOUNTER — Emergency Department (HOSPITAL_COMMUNITY)
Admission: EM | Admit: 2015-02-14 | Discharge: 2015-02-14 | Disposition: A | Discharge status: Home / Self Care | Payer: 59 | Attending: Emergency Medicine | Admitting: Emergency Medicine

## 2015-02-14 DIAGNOSIS — Y999 Unspecified external cause status: Secondary | ICD-10-CM | POA: Insufficient documentation

## 2015-02-14 DIAGNOSIS — W01198A Fall on same level from slipping, tripping and stumbling with subsequent striking against other object, initial encounter: Secondary | ICD-10-CM | POA: Diagnosis not present

## 2015-02-14 DIAGNOSIS — S0990XA Unspecified injury of head, initial encounter: Secondary | ICD-10-CM | POA: Insufficient documentation

## 2015-02-14 DIAGNOSIS — Z79899 Other long term (current) drug therapy: Secondary | ICD-10-CM | POA: Insufficient documentation

## 2015-02-14 DIAGNOSIS — M79605 Pain in left leg: Secondary | ICD-10-CM | POA: Diagnosis not present

## 2015-02-14 DIAGNOSIS — Y92002 Bathroom of unspecified non-institutional (private) residence single-family (private) house as the place of occurrence of the external cause: Secondary | ICD-10-CM | POA: Insufficient documentation

## 2015-02-14 DIAGNOSIS — Y93E1 Activity, personal bathing and showering: Secondary | ICD-10-CM | POA: Insufficient documentation

## 2015-02-14 DIAGNOSIS — G43909 Migraine, unspecified, not intractable, without status migrainosus: Secondary | ICD-10-CM | POA: Insufficient documentation

## 2015-02-14 DIAGNOSIS — Z8742 Personal history of other diseases of the female genital tract: Secondary | ICD-10-CM | POA: Insufficient documentation

## 2015-02-14 DIAGNOSIS — I10 Essential (primary) hypertension: Secondary | ICD-10-CM | POA: Insufficient documentation

## 2015-02-14 DIAGNOSIS — Z792 Long term (current) use of antibiotics: Secondary | ICD-10-CM | POA: Insufficient documentation

## 2015-02-14 DIAGNOSIS — R55 Syncope and collapse: Secondary | ICD-10-CM | POA: Insufficient documentation

## 2015-02-14 LAB — I-STAT TROPONIN, ED: TROPONIN I, POC: 0.01 ng/mL (ref 0.00–0.08)

## 2015-02-14 LAB — BASIC METABOLIC PANEL
ANION GAP: 9 (ref 5–15)
BUN: 13 mg/dL (ref 6–20)
CALCIUM: 10.5 mg/dL — AB (ref 8.9–10.3)
CO2: 29 mmol/L (ref 22–32)
Chloride: 101 mmol/L (ref 101–111)
Creatinine, Ser: 0.88 mg/dL (ref 0.44–1.00)
Glucose, Bld: 99 mg/dL (ref 65–99)
Potassium: 4.9 mmol/L (ref 3.5–5.1)
Sodium: 139 mmol/L (ref 135–145)

## 2015-02-14 LAB — CBC
HEMATOCRIT: 41.9 % (ref 36.0–46.0)
HEMOGLOBIN: 14.1 g/dL (ref 12.0–15.0)
MCH: 32.5 pg (ref 26.0–34.0)
MCHC: 33.7 g/dL (ref 30.0–36.0)
MCV: 96.5 fL (ref 78.0–100.0)
Platelets: 349 10*3/uL (ref 150–400)
RBC: 4.34 MIL/uL (ref 3.87–5.11)
RDW: 13.3 % (ref 11.5–15.5)
WBC: 12.8 10*3/uL — AB (ref 4.0–10.5)

## 2015-02-14 LAB — URINALYSIS, ROUTINE W REFLEX MICROSCOPIC
Bilirubin Urine: NEGATIVE
Glucose, UA: NEGATIVE mg/dL
HGB URINE DIPSTICK: NEGATIVE
Ketones, ur: NEGATIVE mg/dL
Leukocytes, UA: NEGATIVE
Nitrite: NEGATIVE
PH: 8.5 — AB (ref 5.0–8.0)
Protein, ur: NEGATIVE mg/dL
UROBILINOGEN UA: 0.2 mg/dL (ref 0.0–1.0)

## 2015-02-14 LAB — CBG MONITORING, ED: GLUCOSE-CAPILLARY: 100 mg/dL — AB (ref 65–99)

## 2015-02-14 LAB — D-DIMER, QUANTITATIVE: D-Dimer, Quant: 2.29 ug/mL-FEU — ABNORMAL HIGH (ref 0.00–0.48)

## 2015-02-14 MED ORDER — SODIUM CHLORIDE 0.9 % IV BOLUS (SEPSIS)
500.0000 mL | Freq: Once | INTRAVENOUS | Status: AC
  Administered 2015-02-14: 500 mL via INTRAVENOUS

## 2015-02-14 MED ORDER — OXYCODONE-ACETAMINOPHEN 5-325 MG PO TABS
1.0000 | ORAL_TABLET | Freq: Once | ORAL | Status: AC
  Administered 2015-02-14: 1 via ORAL
  Filled 2015-02-14: qty 1

## 2015-02-14 MED ORDER — OXYCODONE-ACETAMINOPHEN 5-325 MG PO TABS: 1.0000 | ORAL_TABLET | Freq: Four times a day (QID) | ORAL | Status: DC | PRN

## 2015-02-14 MED ORDER — IOHEXOL 350 MG/ML SOLN
100.0000 mL | Freq: Once | INTRAVENOUS | Status: AC | PRN
  Administered 2015-02-14: 100 mL via INTRAVENOUS

## 2015-02-14 NOTE — ED Notes (Signed)
Per Pt, Pt was at home and she got up in the middle of the night. Pt reports almost falling, but got back in the bed and had some twitching during the night. Pt was in the bathroom this morning when she lost consciousness and reports waking up on the floor. Husband reports pt taking the shower door off the hinges when she fell. Pt reports SOB after she fell that resolved when she was helped up by her husband. Denies Chest pain, nausea, diarrhea, vomiting. Reports headache, dizziness, and numbness on the left lower leg intermittently. Pt has Hx of bulging disks.
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
## 2015-02-14 NOTE — ED Notes (Signed)
Returned from CT.

## 2015-02-14 NOTE — Discharge Instructions (Signed)
Your CT scan shows an enlarging of the aorta in your chest.  This needs to be followed up by your family doctor and a Neurosurgeon.  Be sure to take your blood pressure medications daily.    Syncope Syncope is a medical term for fainting or passing out. This means you lose consciousness and drop to the ground. People are generally unconscious for less than 5 minutes. You may have some muscle twitches for up to 15 seconds before waking up and returning to normal. Syncope occurs more often in older adults, but it can happen to anyone. While most causes of syncope are not dangerous, syncope can be a sign of a serious medical problem. It is important to seek medical care.  CAUSES  Syncope is caused by a sudden drop in blood flow to the brain. The specific cause is often not determined. Factors that can bring on syncope include:  Taking medicines that lower blood pressure.  Sudden changes in posture, such as standing up quickly.  Taking more medicine than prescribed.  Standing in one place for too long.  Seizure disorders.  Dehydration and excessive exposure to heat.  Low blood sugar (hypoglycemia).  Straining to have a bowel movement.  Heart disease, irregular heartbeat, or other circulatory problems.  Fear, emotional distress, seeing blood, or severe pain. SYMPTOMS  Right before fainting, you may:  Feel dizzy or light-headed.  Feel nauseous.  See all white or all black in your field of vision.  Have cold, clammy skin. DIAGNOSIS  Your health care provider will ask about your symptoms, perform a physical exam, and perform an electrocardiogram (ECG) to record the electrical activity of your heart. Your health care provider may also perform other heart or blood tests to determine the cause of your syncope which may include:  Transthoracic echocardiogram (TTE). During echocardiography, sound waves are used to evaluate how blood flows through your heart.  Transesophageal  echocardiogram (TEE).  Cardiac monitoring. This allows your health care provider to monitor your heart rate and rhythm in real time.  Holter monitor. This is a portable device that records your heartbeat and can help diagnose heart arrhythmias. It allows your health care provider to track your heart activity for several days, if needed.  Stress tests by exercise or by giving medicine that makes the heart beat faster. TREATMENT  In most cases, no treatment is needed. Depending on the cause of your syncope, your health care provider may recommend changing or stopping some of your medicines. HOME CARE INSTRUCTIONS  Have someone stay with you until you feel stable.  Do not drive, use machinery, or play sports until your health care provider says it is okay.  Keep all follow-up appointments as directed by your health care provider.  Lie down right away if you start feeling like you might faint. Breathe deeply and steadily. Wait until all the symptoms have passed.  Drink enough fluids to keep your urine clear or pale yellow.  If you are taking blood pressure or heart medicine, get up slowly and take several minutes to sit and then stand. This can reduce dizziness. SEEK IMMEDIATE MEDICAL CARE IF:   You have a severe headache.  You have unusual pain in the chest, abdomen, or back.  You are bleeding from your mouth or rectum, or you have black or tarry stool.  You have an irregular or very fast heartbeat.  You have pain with breathing.  You have repeated fainting or seizure-like jerking during an episode.  You faint when sitting or lying down.  You have confusion.  You have trouble walking.  You have severe weakness.  You have vision problems. If you fainted, call your local emergency services (911 in [place] Do not drive yourself to the hospital.    This information is not intended to replace advice given to you by your health care provider. Make sure you discuss any questions  you have with your health care provider.   Document Released: 03/19/2005 Document Revised: 08/03/2014 Document Reviewed: 05/18/2011 Elsevier Interactive Patient Education 2016 Elsevier Inc. Sciatica Sciatica is pain, weakness, numbness, or tingling along the path of the sciatic nerve. The nerve starts in the lower back and runs down the back of each leg. The nerve controls the muscles in the lower leg and in the back of the knee, while also providing sensation to the back of the thigh, lower leg, and the sole of your foot. Sciatica is a symptom of another medical condition. For instance, nerve damage or certain conditions, such as a herniated disk or bone spur on the spine, pinch or put pressure on the sciatic nerve. This causes the pain, weakness, or other sensations normally associated with sciatica. Generally, sciatica only affects one side of the body. CAUSES   Herniated or slipped disc.  Degenerative disk disease.  A pain disorder involving the narrow muscle in the buttocks (piriformis syndrome).  Pelvic injury or fracture.  Pregnancy.  Tumor (rare). SYMPTOMS  Symptoms can vary from mild to very severe. The symptoms usually travel from the low back to the buttocks and down the back of the leg. Symptoms can include:  Mild tingling or dull aches in the lower back, leg, or hip.  Numbness in the back of the calf or sole of the foot.  Burning sensations in the lower back, leg, or hip.  Sharp pains in the lower back, leg, or hip.  Leg weakness.  Severe back pain inhibiting movement. These symptoms may get worse with coughing, sneezing, laughing, or prolonged sitting or standing. Also, being overweight may worsen symptoms. DIAGNOSIS  Your caregiver will perform a physical exam to look for common symptoms of sciatica. He or she may ask you to do certain movements or activities that would trigger sciatic nerve pain. Other tests may be performed to find the cause of the sciatica. These  may include:  Blood tests.  X-rays.  Imaging tests, such as an MRI or CT scan. TREATMENT  Treatment is directed at the cause of the sciatic pain. Sometimes, treatment is not necessary and the pain and discomfort goes away on its own. If treatment is needed, your caregiver may suggest:  Over-the-counter medicines to relieve pain.  Prescription medicines, such as anti-inflammatory medicine, muscle relaxants, or narcotics.  Applying heat or ice to the painful area.  Steroid injections to lessen pain, irritation, and inflammation around the nerve.  Reducing activity during periods of pain.  Exercising and stretching to strengthen your abdomen and improve flexibility of your spine. Your caregiver may suggest losing weight if the extra weight makes the back pain worse.  Physical therapy.  Surgery to eliminate what is pressing or pinching the nerve, such as a bone spur or part of a herniated disk. HOME CARE INSTRUCTIONS   Only take over-the-counter or prescription medicines for pain or discomfort as directed by your caregiver.  Apply ice to the affected area for 20 minutes, 3-4 times a day for the first 48-72 hours. Then try heat in the same way.  Exercise, stretch, or perform your usual activities if these do not aggravate your pain.  Attend physical therapy sessions as directed by your caregiver.  Keep all follow-up appointments as directed by your caregiver.  Do not wear high heels or shoes that do not provide proper support.  Check your mattress to see if it is too soft. A firm mattress may lessen your pain and discomfort. SEEK IMMEDIATE MEDICAL CARE IF:   You lose control of your bowel or bladder (incontinence).  You have increasing weakness in the lower back, pelvis, buttocks, or legs.  You have redness or swelling of your back.  You have a burning sensation when you urinate.  You have pain that gets worse when you lie down or awakens you at night.  Your pain is  worse than you have experienced in the past.  Your pain is lasting longer than 4 weeks.  You are suddenly losing weight without reason. MAKE SURE YOU:  Understand these instructions.  Will watch your condition.  Will get help right away if you are not doing well or get worse.   This information is not intended to replace advice given to you by your health care provider. Make sure you discuss any questions you have with your health care provider.   Document Released: 03/13/2001 Document Revised: 12/08/2014 Document Reviewed: 07/29/2011 Elsevier Interactive Patient Education [employer].

## 2015-02-14 NOTE — ED Notes (Signed)
MD Rees at the bedside  

## 2015-03-03 ENCOUNTER — Inpatient Hospital Stay (HOSPITAL_COMMUNITY)
Admission: EM | Admit: 2015-03-03 | Discharge: 2015-03-10 | DRG: 517 | Disposition: A | Discharge status: Home Health | Payer: 59 | Attending: Neurological Surgery | Admitting: Neurological Surgery

## 2015-03-03 ENCOUNTER — Emergency Department (HOSPITAL_COMMUNITY): Payer: 59

## 2015-03-03 ENCOUNTER — Inpatient Hospital Stay: Admission: AD | Admit: 2015-03-03 | Payer: 59 | Source: Ambulatory Visit | Admitting: Neurological Surgery

## 2015-03-03 ENCOUNTER — Encounter (HOSPITAL_COMMUNITY): Payer: Self-pay | Admitting: Emergency Medicine

## 2015-03-03 DIAGNOSIS — Z7952 Long term (current) use of systemic steroids: Secondary | ICD-10-CM

## 2015-03-03 DIAGNOSIS — G43909 Migraine, unspecified, not intractable, without status migrainosus: Secondary | ICD-10-CM | POA: Diagnosis present

## 2015-03-03 DIAGNOSIS — M47816 Spondylosis without myelopathy or radiculopathy, lumbar region: Secondary | ICD-10-CM | POA: Diagnosis present

## 2015-03-03 DIAGNOSIS — Y798 Miscellaneous orthopedic devices associated with adverse incidents, not elsewhere classified: Secondary | ICD-10-CM | POA: Diagnosis present

## 2015-03-03 DIAGNOSIS — T84028A Dislocation of other internal joint prosthesis, initial encounter: Secondary | ICD-10-CM | POA: Diagnosis present

## 2015-03-03 DIAGNOSIS — M25559 Pain in unspecified hip: Secondary | ICD-10-CM | POA: Diagnosis present

## 2015-03-03 DIAGNOSIS — M4316 Spondylolisthesis, lumbar region: Secondary | ICD-10-CM | POA: Diagnosis present

## 2015-03-03 DIAGNOSIS — G8918 Other acute postprocedural pain: Secondary | ICD-10-CM | POA: Diagnosis present

## 2015-03-03 DIAGNOSIS — Z419 Encounter for procedure for purposes other than remedying health state, unspecified: Secondary | ICD-10-CM

## 2015-03-03 LAB — CBC
HCT: 27.5 % — ABNORMAL LOW (ref 36.0–46.0)
HEMOGLOBIN: 9 g/dL — AB (ref 12.0–15.0)
MCH: 31.8 pg (ref 26.0–34.0)
MCHC: 32.7 g/dL (ref 30.0–36.0)
MCV: 97.2 fL (ref 78.0–100.0)
PLATELETS: 187 10*3/uL (ref 150–400)
RBC: 2.83 MIL/uL — ABNORMAL LOW (ref 3.87–5.11)
RDW: 13.3 % (ref 11.5–15.5)
WBC: 10.2 10*3/uL (ref 4.0–10.5)

## 2015-03-03 LAB — BASIC METABOLIC PANEL
Anion gap: 3 — ABNORMAL LOW (ref 5–15)
BUN: 10 mg/dL (ref 6–20)
CALCIUM: 8.4 mg/dL — AB (ref 8.9–10.3)
CHLORIDE: 108 mmol/L (ref 101–111)
CO2: 29 mmol/L (ref 22–32)
CREATININE: 0.86 mg/dL (ref 0.44–1.00)
Glucose, Bld: 108 mg/dL — ABNORMAL HIGH (ref 65–99)
Potassium: 4 mmol/L (ref 3.5–5.1)
SODIUM: 140 mmol/L (ref 135–145)

## 2015-03-03 MED ORDER — DIPHENHYDRAMINE HCL 12.5 MG/5ML PO ELIX: 12.5000 mg | ORAL_SOLUTION | Freq: Four times a day (QID) | ORAL | Status: DC | PRN

## 2015-03-03 MED ORDER — HYDROMORPHONE 1 MG/ML IV SOLN
INTRAVENOUS | Status: DC
  Filled 2015-03-03: qty 25

## 2015-03-03 MED ORDER — ADULT MULTIVITAMIN LIQUID CH
5.0000 mL | Freq: Every day | ORAL | Status: DC
  Filled 2015-03-03 (×3): qty 5

## 2015-03-03 MED ORDER — POLYETHYLENE GLYCOL 3350 17 G PO PACK
17.0000 g | PACK | Freq: Every day | ORAL | Status: DC | PRN
  Administered 2015-03-05: 17 g via ORAL
  Filled 2015-03-03: qty 1

## 2015-03-03 MED ORDER — OXYCODONE-ACETAMINOPHEN 5-325 MG PO TABS
1.0000 | ORAL_TABLET | Freq: Four times a day (QID) | ORAL | Status: DC | PRN
  Administered 2015-03-03 – 2015-03-04 (×2): 1 via ORAL
  Filled 2015-03-03 (×2): qty 1

## 2015-03-03 MED ORDER — SODIUM CHLORIDE 0.9 % IJ SOLN: 9.0000 mL | INTRAMUSCULAR | Status: DC | PRN

## 2015-03-03 MED ORDER — GABAPENTIN 300 MG PO CAPS
300.0000 mg | ORAL_CAPSULE | Freq: Three times a day (TID) | ORAL | Status: DC
  Administered 2015-03-03 – 2015-03-10 (×20): 300 mg via ORAL
  Filled 2015-03-03 (×20): qty 1

## 2015-03-03 MED ORDER — DIPHENHYDRAMINE HCL 50 MG/ML IJ SOLN: 12.5000 mg | Freq: Four times a day (QID) | INTRAMUSCULAR | Status: DC | PRN

## 2015-03-03 MED ORDER — PHENOL 1.4 % MT LIQD: 1.0000 | OROMUCOSAL | Status: DC | PRN

## 2015-03-03 MED ORDER — VERAPAMIL HCL ER 120 MG PO TBCR
120.0000 mg | EXTENDED_RELEASE_TABLET | Freq: Every day | ORAL | Status: DC
  Administered 2015-03-03 – 2015-03-09 (×5): 120 mg via ORAL
  Filled 2015-03-03 (×11): qty 1

## 2015-03-03 MED ORDER — NALOXONE HCL 0.4 MG/ML IJ SOLN: 0.4000 mg | INTRAMUSCULAR | Status: DC | PRN

## 2015-03-03 MED ORDER — DOCUSATE SODIUM 100 MG PO CAPS
100.0000 mg | ORAL_CAPSULE | Freq: Two times a day (BID) | ORAL | Status: DC
  Administered 2015-03-03 – 2015-03-10 (×13): 100 mg via ORAL
  Filled 2015-03-03 (×14): qty 1

## 2015-03-03 MED ORDER — METHOCARBAMOL 500 MG PO TABS
500.0000 mg | ORAL_TABLET | Freq: Four times a day (QID) | ORAL | Status: DC | PRN
  Administered 2015-03-03 – 2015-03-10 (×12): 500 mg via ORAL
  Filled 2015-03-03 (×13): qty 1

## 2015-03-03 MED ORDER — SENNA 8.6 MG PO TABS
1.0000 | ORAL_TABLET | Freq: Two times a day (BID) | ORAL | Status: DC
  Administered 2015-03-03 – 2015-03-10 (×13): 8.6 mg via ORAL
  Filled 2015-03-03 (×14): qty 1

## 2015-03-03 MED ORDER — OXYCODONE-ACETAMINOPHEN 5-325 MG PO TABS: 1.0000 | ORAL_TABLET | ORAL | Status: DC | PRN

## 2015-03-03 MED ORDER — PANTOPRAZOLE SODIUM 40 MG IV SOLR
40.0000 mg | Freq: Every day | INTRAVENOUS | Status: DC
  Administered 2015-03-03: 40 mg via INTRAVENOUS
  Filled 2015-03-03: qty 40

## 2015-03-03 MED ORDER — ACETAMINOPHEN 650 MG RE SUPP: 650.0000 mg | RECTAL | Status: DC | PRN

## 2015-03-03 MED ORDER — SODIUM CHLORIDE 0.9 % IV SOLN
INTRAVENOUS | Status: DC
  Administered 2015-03-03 – 2015-03-07 (×3): via INTRAVENOUS

## 2015-03-03 MED ORDER — ONDANSETRON HCL 4 MG/2ML IJ SOLN: 4.0000 mg | Freq: Four times a day (QID) | INTRAMUSCULAR | Status: DC | PRN

## 2015-03-03 MED ORDER — ONDANSETRON HCL 4 MG/2ML IJ SOLN
4.0000 mg | INTRAMUSCULAR | Status: DC | PRN
  Administered 2015-03-03: 4 mg via INTRAVENOUS
  Filled 2015-03-03: qty 2

## 2015-03-03 MED ORDER — PREDNISONE 20 MG PO TABS: 20.0000 mg | ORAL_TABLET | Freq: Every day | ORAL | Status: DC

## 2015-03-03 MED ORDER — MONTELUKAST SODIUM 10 MG PO TABS
10.0000 mg | ORAL_TABLET | Freq: Every day | ORAL | Status: DC
  Administered 2015-03-03 – 2015-03-09 (×7): 10 mg via ORAL
  Filled 2015-03-03 (×8): qty 1

## 2015-03-03 MED ORDER — FLEET ENEMA 7-19 GM/118ML RE ENEM: 1.0000 | ENEMA | Freq: Once | RECTAL | Status: DC | PRN

## 2015-03-03 MED ORDER — SODIUM CHLORIDE 0.9 % IJ SOLN
3.0000 mL | Freq: Two times a day (BID) | INTRAMUSCULAR | Status: DC
  Administered 2015-03-03 – 2015-03-10 (×9): 3 mL via INTRAVENOUS

## 2015-03-03 MED ORDER — PROMETHAZINE HCL 25 MG PO TABS
25.0000 mg | ORAL_TABLET | ORAL | Status: DC | PRN
  Administered 2015-03-04: 25 mg via ORAL
  Filled 2015-03-03: qty 1

## 2015-03-03 MED ORDER — SERTRALINE HCL 100 MG PO TABS
100.0000 mg | ORAL_TABLET | Freq: Every day | ORAL | Status: DC
Start: 2015-03-03 — End: 2015-03-10
  Administered 2015-03-03 – 2015-03-10 (×8): 100 mg via ORAL
  Filled 2015-03-03 (×8): qty 1

## 2015-03-03 MED ORDER — METHOCARBAMOL 1000 MG/10ML IJ SOLN
500.0000 mg | Freq: Four times a day (QID) | INTRAVENOUS | Status: DC | PRN
  Filled 2015-03-03: qty 5

## 2015-03-03 MED ORDER — SODIUM CHLORIDE 0.9 % IJ SOLN: 3.0000 mL | INTRAMUSCULAR | Status: DC | PRN

## 2015-03-03 MED ORDER — HYDROMORPHONE HCL 1 MG/ML IJ SOLN
0.5000 mg | INTRAMUSCULAR | Status: DC | PRN
  Administered 2015-03-03 – 2015-03-09 (×8): 1 mg via INTRAVENOUS
  Filled 2015-03-03 (×8): qty 1

## 2015-03-03 MED ORDER — ONDANSETRON HCL 4 MG/2ML IJ SOLN
4.0000 mg | Freq: Four times a day (QID) | INTRAMUSCULAR | Status: DC | PRN
  Administered 2015-03-03: 4 mg via INTRAVENOUS
  Filled 2015-03-03: qty 2

## 2015-03-03 MED ORDER — SODIUM CHLORIDE 0.9 % IV SOLN: 250.0000 mL | INTRAVENOUS | Status: DC

## 2015-03-03 MED ORDER — SUMATRIPTAN SUCCINATE 50 MG PO TABS
50.0000 mg | ORAL_TABLET | ORAL | Status: AC | PRN
  Administered 2015-03-04 (×2): 50 mg via ORAL
  Filled 2015-03-03 (×5): qty 1

## 2015-03-03 MED ORDER — MENTHOL 3 MG MT LOZG: 1.0000 | LOZENGE | OROMUCOSAL | Status: DC | PRN

## 2015-03-03 MED ORDER — ACETAMINOPHEN 325 MG PO TABS
650.0000 mg | ORAL_TABLET | ORAL | Status: DC | PRN
  Administered 2015-03-09: 650 mg via ORAL
  Filled 2015-03-03: qty 2

## 2015-03-03 MED ORDER — VITAMIN D 1000 UNITS PO TABS
1000.0000 [IU] | ORAL_TABLET | Freq: Every day | ORAL | Status: DC
  Administered 2015-03-03 – 2015-03-10 (×8): 1000 [IU] via ORAL
  Filled 2015-03-03 (×8): qty 1

## 2015-03-03 MED ORDER — FENTANYL CITRATE (PF) 100 MCG/2ML IJ SOLN: 50.0000 ug | INTRAMUSCULAR | Status: DC | PRN

## 2015-03-03 NOTE — ED Notes (Signed)
Dr. Cyndy Freeze office contacted [phone], spoke with Crystal - she will be in contact with MD for orders.
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
## 2015-03-03 NOTE — ED Notes (Signed)
Per EMS, patient having increased pain after back surgery yesterday.   Patient was sent here by doctor for pain management.   Patient states she has been unable to get relief.   Patient states had lumbar surgery, but unsure of what the procedure was called or exactly where.   Patient unable to stand due to severe pain in hips, per patient report.

## 2015-03-03 NOTE — ED Notes (Signed)
Husband: Sonny:  Cell:  [phone] if needed.

## 2015-03-03 NOTE — ED Provider Notes (Addendum)
Patient of Dr Cyndy Freeze, neurosurgery. Recent lumbar fusion.  He is to be notified on patient arrival to ED.  Virgel Manifold, MD 03/03/15 [phone]  Pt arrived to ED. Charge nurse aware and to page Dr Cyndy Freeze. PRN fentanyl ordered for time being.   Virgel Manifold, MD 03/03/15 1020

## 2015-03-03 NOTE — ED Notes (Signed)
Pt awaiting admission orders.  Pt reports pain is 4/10, reports needing to void but requesting not to get up or to get on bedpan, pt states "I don't think I'll be able to go on my on".  Foley cath offered, will call for order.

## 2015-03-04 LAB — CBC
HCT: 25.6 % — ABNORMAL LOW (ref 36.0–46.0)
Hemoglobin: 8.4 g/dL — ABNORMAL LOW (ref 12.0–15.0)
MCH: 32.4 pg (ref 26.0–34.0)
MCHC: 32.8 g/dL (ref 30.0–36.0)
MCV: 98.8 fL (ref 78.0–100.0)
PLATELETS: 158 10*3/uL (ref 150–400)
RBC: 2.59 MIL/uL — AB (ref 3.87–5.11)
RDW: 13.8 % (ref 11.5–15.5)
WBC: 8.7 10*3/uL (ref 4.0–10.5)

## 2015-03-04 LAB — BASIC METABOLIC PANEL
ANION GAP: 4 — AB (ref 5–15)
BUN: 10 mg/dL (ref 6–20)
CALCIUM: 8 mg/dL — AB (ref 8.9–10.3)
CO2: 27 mmol/L (ref 22–32)
CREATININE: 1.02 mg/dL — AB (ref 0.44–1.00)
Chloride: 110 mmol/L (ref 101–111)
GFR calc Af Amer: >60 mL/min (ref >60)
GFR calc non Af Amer: >60 mL/min (ref >60)
GLUCOSE: 83 mg/dL (ref 65–99)
POTASSIUM: 4.1 mmol/L (ref 3.5–5.1)
SODIUM: 141 mmol/L (ref 135–145)

## 2015-03-04 MED ORDER — OXYCODONE-ACETAMINOPHEN 5-325 MG PO TABS
1.0000 | ORAL_TABLET | ORAL | Status: DC | PRN
  Administered 2015-03-04: 2 via ORAL
  Administered 2015-03-05 – 2015-03-06 (×4): 1 via ORAL
  Administered 2015-03-06 (×2): 2 via ORAL
  Administered 2015-03-06: 1 via ORAL
  Administered 2015-03-07 – 2015-03-09 (×8): 2 via ORAL
  Administered 2015-03-10: 1 via ORAL
  Filled 2015-03-04 (×5): qty 2
  Filled 2015-03-04 (×2): qty 1
  Filled 2015-03-04: qty 2
  Filled 2015-03-04: qty 1
  Filled 2015-03-04: qty 2
  Filled 2015-03-04 (×3): qty 1
  Filled 2015-03-04 (×5): qty 2

## 2015-03-04 MED ORDER — PROMETHAZINE HCL 25 MG/ML IJ SOLN
25.0000 mg | Freq: Once | INTRAMUSCULAR | Status: AC
  Administered 2015-03-04: 25 mg via INTRAMUSCULAR
  Filled 2015-03-04: qty 1

## 2015-03-04 MED ORDER — PANTOPRAZOLE SODIUM 40 MG PO TBEC
40.0000 mg | DELAYED_RELEASE_TABLET | Freq: Every day | ORAL | Status: DC
  Administered 2015-03-04 – 2015-03-09 (×6): 40 mg via ORAL
  Filled 2015-03-04 (×6): qty 1

## 2015-03-04 MED ORDER — PROMETHAZINE HCL 25 MG/ML IJ SOLN: 25.0000 mg | Freq: Four times a day (QID) | INTRAMUSCULAR | Status: DC | PRN

## 2015-03-04 NOTE — Progress Notes (Signed)
OT Cancellation Note  Patient Details Name: Sarah Fowler MRN: [id number] DOB: [birth year]   Cancelled Treatment:    Reason Eval/Treat Not Completed: Pain limiting ability to participate (Severe migraine, pt just given medication). Pt's brace also not in room yet. Will attempt to see this afternoon if time allows.  Redmond Baseman, OTR/L 03/04/2015, 10:20 AM

## 2015-03-04 NOTE — Progress Notes (Signed)
PT Cancellation Note  Patient Details Name: Sarah Fowler MRN: [id number] DOB: 15-Dec-1960   Cancelled Treatment:    Reason Eval/Treat Not Completed: Fatigue/lethargy limiting ability to participate. Pt too lethargic to participate with therapy this afternoon. Per RN, pt received medication for migraine. Brace has now arrived.   Rolinda Roan 03/04/2015, 2:47 PM   Rolinda Roan, PT, DPT Acute Rehabilitation Services Pager: [phone]

## 2015-03-04 NOTE — Progress Notes (Signed)
Orthopedic Tech Progress Note Patient Details:  Sarah Fowler 12/11/1960 [id number]  Patient ID: Sarah Fowler, female   DOB: Oct 23, 1960, 54 y.o.   MRN: [id number] Called in bio-tech brace order; spoke with Buckner Malta, Chealsey Miyamoto 03/04/2015, 11:40 AM

## 2015-03-04 NOTE — Progress Notes (Signed)
Back and hip pain improving, now with migraine headache AVSS Awake and alert Motor 5/5 all groups of BLE Sensation intact BLE Bandage c/d/i Neurologically well Pain improving Now trying to control headaches Brace ordered PT/OT D/c drain Ok to take home zolmitriptan

## 2015-03-04 NOTE — Progress Notes (Signed)
PT Cancellation Note  Patient Details Name: Sarah Fowler MRN: [id number] DOB: 1960/06/29   Cancelled Treatment:    Reason Eval/Treat Not Completed: Per RN, pt does not have brace yet. Pt in bed with eyes closed and wash cloth over head. Will check back as schedule allows to complete PT eval.    Rolinda Roan 03/04/2015, 11:45 AM   Rolinda Roan, PT, DPT Acute Rehabilitation Services Pager: [phone]

## 2015-03-05 MED ORDER — ADULT MULTIVITAMIN W/MINERALS CH
1.0000 | ORAL_TABLET | Freq: Every day | ORAL | Status: DC
  Administered 2015-03-06 – 2015-03-10 (×4): 1 via ORAL
  Filled 2015-03-05 (×6): qty 1

## 2015-03-05 NOTE — Progress Notes (Signed)
OT Cancellation Note  Patient Details Name: Sarah Fowler MRN: [id number] DOB: 1960/12/09   Cancelled Treatment:    Reason Eval/Treat Not Completed: Pain limiting ability to participate  Benito Mccreedy OTR/L [id number] 03/05/2015, 12:32 PM

## 2015-03-05 NOTE — Evaluation (Signed)
Physical Therapy Evaluation Patient Details Name: Sarah Fowler MRN: [id number] DOB: Jan 01, 1961 Today's Date: 03/05/2015   History of Present Illness  54 y.o. female s/p PLIF at [facility] 03-02-15. She stayed overnight at the surgery center, then transfered to [facility] 03-03-15 for admission due to pain.  Clinical Impression  Patient is s/p above surgery resulting in the deficits listed below (see PT Problem List). On eval, pt required min assist for all functional mobility. Ambulation distance limited by severe pain with weight bearing. Pt required return to sidelying in bed. Patient will benefit from skilled PT to increase their independence and safety with mobility (while adhering to their precautions) to allow discharge to the venue listed below. Pt demo good potential for progression of mobility to supervision level when pain decreases.     Follow Up Recommendations Home health PT;Supervision/Assistance - 24 hour    Equipment Recommendations  None recommended by PT    Recommendations for Other Services       Precautions / Restrictions Precautions Precautions: Fall;Back Precaution Comments: Educated pt on 3/3 back precautions. Required Braces or Orthoses: Spinal Brace Spinal Brace: Lumbar corset;Applied in sitting position      Mobility  Bed Mobility Overal bed mobility: Needs Assistance Bed Mobility: Supine to Sit;Sit to Supine     Supine to sit: Min assist Sit to supine: Min assist   General bed mobility comments: verbal cues for logroll  Transfers Overall transfer level: Needs assistance Equipment used: Rolling walker (2 wheeled) Transfers: Sit to/from Stand Sit to Stand: Min assist         General transfer comment: verbal cues for hand placement  Ambulation/Gait Ambulation/Gait assistance: Min assist Ambulation Distance (Feet): 5 Feet (forward and backwards to return to bed) Assistive device: Rolling walker (2 wheeled) Gait Pattern/deviations:  Antalgic;Decreased stride length;Step-through pattern;Decreased stance time - right Gait velocity: very slow, guarded gait   General Gait Details: Significant increase in pain with weight bearing requiring backward steps to return to bed.  Stairs            Wheelchair Mobility    Modified Rankin (Stroke Patients Only)       Balance                                             Pertinent Vitals/Pain Pain Assessment: 0-10 Pain Score: 10-Worst pain ever Pain Location: RLE with weight bearing Pain Descriptors / Indicators: Aching Pain Intervention(s): Limited activity within patient's tolerance;Repositioned;Monitored during session    Home Living Family/patient expects to be discharged to:: Private residence Living Arrangements: Spouse/significant other Available Help at Discharge: Family;Available 24 hours/day Type of Home: Mobile home Home Access: Stairs to enter Entrance Stairs-Rails: Right;Left Entrance Stairs-Number of Steps: 5 Home Layout: One level Home Equipment: Walker - 2 wheels;Crutches;Wheelchair - manual Additional Comments: Pt has equipment from previous foot surgery.    Prior Function Level of Independence: Independent               Hand Dominance        Extremity/Trunk Assessment                         Communication   Communication: No difficulties  Cognition Arousal/Alertness: Awake/alert Behavior During Therapy: WFL for tasks assessed/performed Overall Cognitive Status: Within Functional Limits for tasks assessed  General Comments      Exercises        Assessment/Plan    PT Assessment Patient needs continued PT services  PT Diagnosis Difficulty walking;Acute pain   PT Problem List Decreased strength;Decreased activity tolerance;Decreased balance;Decreased mobility;Pain;Decreased knowledge of precautions  PT Treatment Interventions DME instruction;Gait training;Stair  training;Functional mobility training;Therapeutic activities;Therapeutic exercise;Patient/family education;Balance training   PT Goals (Current goals can be found in the Care Plan section) Acute Rehab PT Goals Patient Stated Goal: decrease pain PT Goal Formulation: With patient Time For Goal Achievement: 03/19/15 Potential to Achieve Goals: Good    Frequency Min 5X/week   Barriers to discharge        Co-evaluation               End of Session Equipment Utilized During Treatment: Gait belt;Back brace Activity Tolerance: Patient limited by pain Patient left: in bed;with family/visitor present;with call bell/phone within reach Nurse Communication: Mobility status         Time: [id number] PT Time Calculation (min) (ACUTE ONLY): 18 min   Charges:   PT Evaluation $Initial PT Evaluation Tier I: 1 Procedure     PT G CodesLorriane Shire 03/05/2015, 9:58 AM

## 2015-03-05 NOTE — Progress Notes (Signed)
Headaches and hip pain improved today. Now has bilateral leg pain with standing. AVSS Full strength in quadriceps, dorsiflexion, EHL, plantar flexion bilaterally. Normal sensation in both legs. Somewhat improved but also with some new symptoms. Continue PT Continue pain control

## 2015-03-05 NOTE — Evaluation (Signed)
Occupational Therapy Evaluation Patient Details Name: Arnetra Huxford MRN: [id number] DOB: 1961/01/06 Today's Date: 03/05/2015    History of Present Illness 54 y.o. female s/p PLIF at [facility] 03-02-15. She stayed overnight at the surgery center, then transfered to [facility] 03-03-15 for admission due to pain.   Clinical Impression   Pt admitted with above. Pt independent with ADLs, PTA. Feel pt will benefit from acute OT to increase independence prior to d/c.   Follow Up Recommendations  No OT follow up;Supervision/Assistance - 24 hour    Equipment Recommendations  3 in 1 bedside comode    Recommendations for Other Services       Precautions / Restrictions Precautions Precautions: Fall;Back Precaution Booklet Issued: No Precaution Comments: reviewed back precautions Required Braces or Orthoses: Spinal Brace Spinal Brace: Lumbar corset;Applied in sitting position Restrictions Weight Bearing Restrictions: No      Mobility Bed Mobility Overal bed mobility: Needs Assistance Bed Mobility: Sidelying to Sit;Sit to Sidelying   Sidelying to sit: Modified independent (Device/Increase time)  Sit to sidelying: Supervision General bed mobility comments: pt able to come to sitting with no physical assist. After sitting on the edge of the bed for a little while, she became lightheaded.   Transfers     General transfer comment: not assessed due to dizziness/lightheaded    Balance  Balance not formally assessed                                          ADL Overall ADL's : Needs assistance/impaired                 Upper Body Dressing : Moderate assistance;Sitting   Lower Body Dressing: Maximal assistance;Sitting/lateral leans     Toilet Transfer Details (indicate cue type and reason): did not stand due to dizziness/lightheaded           General ADL Comments: Discussed incorporating precautions into functional activities. Explained to  have clothing under back brace and no sleeping in brace.     Vision     Perception     Praxis      Pertinent Vitals/Pain Pain Assessment: 0-10 Pain Score:  (6.5) Pain Location: bilateral legs, head, stomach Pain Descriptors / Indicators: Throbbing;Cramping Pain Intervention(s): Monitored during session;Repositioned     Hand Dominance Right   Extremity/Trunk Assessment Upper Extremity Assessment Upper Extremity Assessment: Overall WFL for tasks assessed   Lower Extremity Assessment Lower Extremity Assessment: Defer to PT evaluation       Communication Communication Communication: No difficulties   Cognition Arousal/Alertness: Awake/alert Behavior During Therapy: WFL for tasks assessed/performed Overall Cognitive Status: Within Functional Limits for tasks assessed                     General Comments       Exercises       Shoulder Instructions      Home Living Family/patient expects to be discharged to:: Private residence Living Arrangements: Spouse/significant other Available Help at Discharge: Family;Available 24 hours/day Type of Home: Mobile home Home Access: Stairs to enter Entrance Stairs-Number of Steps: 5 Entrance Stairs-Rails: Right;Left Home Layout: One level     Bathroom Shower/Tub: Tub/shower unit;Walk-in shower   Bathroom Toilet: Standard     Home Equipment: Walker - 2 wheels;Crutches;Wheelchair - manual;Shower seat - built in   Additional Comments: Pt has equipment from previous foot surgery.  Prior Functioning/Environment Level of Independence: Independent             OT Diagnosis: Acute pain   OT Problem List: Decreased knowledge of precautions;Decreased knowledge of use of DME or AE;Decreased activity tolerance;Pain   OT Treatment/Interventions: Self-care/ADL training;DME and/or AE instruction;Therapeutic activities;Balance training;Patient/family education    OT Goals(Current goals can be found in the care  plan section) Acute Rehab OT Goals Patient Stated Goal: to get up and move and to have bowel movement OT Goal Formulation: With patient Time For Goal Achievement: 03/12/15 Potential to Achieve Goals: Good ADL Goals Pt Will Perform Lower Body Dressing: with set-up;with supervision;sit to/from stand;with adaptive equipment;with caregiver independent in assisting Pt Will Transfer to Toilet: with supervision;ambulating Pt Will Perform Toileting - Clothing Manipulation and hygiene: with set-up;with supervision;sit to/from stand Additional ADL Goal #1: Pt will independently verbalize 3/3 back precautions and maintain during session.  OT Frequency: Min 2X/week   Barriers to D/C:            Co-evaluation              End of Session Equipment Utilized During Treatment: Back brace Nurse Communication: Other (comment) (pt became dizzy/lightheaded; wants to have BM; headache)  Activity Tolerance: Other (comment) (dizziness/lightheaded) Patient left: in bed;with call bell/phone within reach; SCDs applied   Time: [id number] OT Time Calculation (min): 12 min Charges:  OT General Charges $OT Visit: 1 Procedure OT Evaluation $Initial OT Evaluation Tier I: 1 Procedure G-CodesBenito Mccreedy OTR/L [id number] 03/05/2015, 1:32 PM

## 2015-03-06 MED ORDER — ENOXAPARIN SODIUM 40 MG/0.4ML ~~LOC~~ SOLN
40.0000 mg | SUBCUTANEOUS | Status: DC
  Administered 2015-03-06: 40 mg via SUBCUTANEOUS
  Filled 2015-03-06: qty 0.4

## 2015-03-06 MED ORDER — DIPHENHYDRAMINE-ZINC ACETATE 2-0.1 % EX CREA
TOPICAL_CREAM | Freq: Four times a day (QID) | CUTANEOUS | Status: DC | PRN
  Administered 2015-03-06: 1 via TOPICAL
  Administered 2015-03-06: 03:00:00 via TOPICAL
  Administered 2015-03-06: 1 via TOPICAL
  Administered 2015-03-07 – 2015-03-09 (×2): via TOPICAL
  Filled 2015-03-06 (×2): qty 28

## 2015-03-06 NOTE — Progress Notes (Signed)
Occupational Therapy Treatment Patient Details Name: Sarah Fowler MRN: [id number] DOB: [birth year] Today's Date: 03/06/2015    History of present illness 54 y.o. female s/p PLIF at [facility] 03-02-15. She stayed overnight at the surgery center, then transfered to [facility] 03-03-15 for admission due to pain.   OT comments  Pt. Making gains with skilled OT.  Able to complete bed mobility and toileting tasks this session. Next session will address LB ADLS.    Follow Up Recommendations  No OT follow up;Supervision/Assistance - 24 hour    Equipment Recommendations  3 in 1 bedside comode    Recommendations for Other Services      Precautions / Restrictions Precautions Precautions: Fall;Back Precaution Comments: reviewed back precautions Required Braces or Orthoses: Spinal Brace Spinal Brace: Lumbar corset;Applied in sitting position Restrictions Weight Bearing Restrictions: No       Mobility Bed Mobility Overal bed mobility: Needs Assistance Bed Mobility: Rolling;Sidelying to Sit Rolling: Min guard Sidelying to sit: Min guard       General bed mobility comments: hob flat, no rail, exits on right side at home.  instructions for each step of log roll technique, with min guard assist to initiate rolling and to begin sidelying to sit.    Transfers Overall transfer level: Needs assistance Equipment used: Rolling walker (2 wheeled) Transfers: Sit to/from [facility] Sit to Stand: Min assist Stand pivot transfers: Min guard       General transfer comment: slow to transition into standing, b legs/knees bent, flexed posture.  physical assist to push through walker and stand upright    Balance                                   ADL Overall ADL's : Needs assistance/impaired                 Upper Body Dressing : Set up;Sitting Upper Body Dressing Details (indicate cue type and reason): able to don brace sitting eob     Toilet  Transfer: Min guard;Ambulation;RW;BSC;Comfort height toilet;Grab bars Toilet Transfer Details (indicate cue type and reason): 3n1 over the commode Toileting- Clothing Manipulation and Hygiene: Sit to/from stand;Min guard       Functional mobility during ADLs: Min guard General ADL Comments: making gains, able to amb.to b.room and complete toileting with peri care      Vision                     Perception     Praxis      Cognition   Behavior During Therapy: [facility] for tasks assessed/performed Overall Cognitive Status: Within Functional Limits for tasks assessed                       Extremity/Trunk Assessment               Exercises     Shoulder Instructions       General Comments      Pertinent Vitals/ Pain       Pain Assessment: 0-10 Pain Score: 7  Pain Location: back Pain Descriptors / Indicators: Aching Pain Intervention(s): Limited activity within patient's tolerance;Monitored during session;Premedicated before session;Repositioned  Home Living  Prior Functioning/Environment              Frequency Min 2X/week     Progress Toward Goals  OT Goals(current goals can now be found in the care plan section)  Progress towards OT goals: Progressing toward goals     Plan Discharge plan remains appropriate    Co-evaluation                 End of Session Equipment Utilized During Treatment: Gait belt;Rolling walker;Back brace   Activity Tolerance Patient tolerated treatment well   Patient Left in chair;with call bell/phone within reach   Nurse Communication Other (comment) (spoke with rn and cna, pt. had first BM )        [id number] OT Time Calculation (min): 25 min  Charges: OT General Charges $OT Visit: 1 Procedure OT Treatments $Self Care/Home Management : 23-37 mins  Janice Coffin, COTA/L 03/06/2015, 8:38 AM

## 2015-03-06 NOTE — Progress Notes (Signed)
Physical Therapy Treatment Patient Details Name: Sarah Fowler MRN: [id number] DOB: [birth year] Today's Date: 03/06/2015    History of Present Illness 54 y.o. female s/p PLIF at [facility] 03-02-15. She stayed overnight at the surgery center, then transfered to [facility] 03-03-15 for admission due to pain.    PT Comments    Pt progressing with mobility but very guarded & slow with movement due to pain.    Follow Up Recommendations  Home health PT;Supervision/Assistance - 24 hour     Equipment Recommendations  None recommended by PT    Recommendations for Other Services       Precautions / Restrictions Precautions Precautions: Fall;Back Precaution Comments: reviewed back precautions Required Braces or Orthoses: Spinal Brace Spinal Brace: Lumbar corset;Applied in sitting position    Mobility  Bed Mobility Overal bed mobility: Needs Assistance Bed Mobility: Sit to Supine Rolling: Min guard Sidelying to sit: Min guard   Sit to supine: Min assist   General bed mobility comments: HOB flat.  (A) for LE's & to maintain back precautions.  cues for technique  Transfers Overall transfer level: Needs assistance Equipment used: Rolling walker (2 wheeled) Transfers: Sit to/from Stand Sit to Stand: Min guard Stand pivot transfers: Min guard       General transfer comment: slow to transition but completed with min guard.  cues for hand placement  Ambulation/Gait Ambulation/Gait assistance: Min guard Ambulation Distance (Feet): 50 Feet Assistive device: Rolling walker (2 wheeled) Gait Pattern/deviations: Step-to pattern;Decreased step length - right Gait velocity: very slow, guarded gait   General Gait Details: Slow & guarded.  decreased step length & floor clearance due to pain.  Cues to relax shoulders.     Stairs            Wheelchair Mobility    Modified Rankin (Stroke Patients Only)       Balance                                     Cognition Arousal/Alertness: Awake/alert Behavior During Therapy: WFL for tasks assessed/performed Overall Cognitive Status: Within Functional Limits for tasks assessed                      Exercises      General Comments        Pertinent Vitals/Pain Pain Assessment: 0-10 Pain Score: 7  Pain Location: back Pain Descriptors / Indicators: Aching Pain Intervention(s): Monitored during session;Premedicated before session;Limited activity within patient's tolerance;Repositioned;RN gave pain meds during session    Home Living                      Prior Function            PT Goals (current goals can now be found in the care plan section) Acute Rehab PT Goals Patient Stated Goal: to go home PT Goal Formulation: With patient Time For Goal Achievement: 03/19/15 Potential to Achieve Goals: Good Progress towards PT goals: Progressing toward goals    Frequency  Min 5X/week    PT Plan Current plan remains appropriate    Co-evaluation             End of Session Equipment Utilized During Treatment: Back brace Activity Tolerance: Patient tolerated treatment well Patient left: in bed;with call bell/phone within reach;with family/visitor present     Time: [id number] PT Time Calculation (min) (ACUTE ONLY): 39 min  Charges:  $Gait Training: 23-37 mins $Therapeutic Activity: 8-22 mins                    G Codes:      Sena Hitch 03/06/2015, 12:01 PM   Sarajane Marek, [place] [phone] 03/06/2015

## 2015-03-06 NOTE — Clinical Social Work Note (Cosign Needed)
CSW received referral for SNF.  PT and OT are recommending home health.  CSW to sign off please re-consult if social work needs arise.  Jones Broom. [employer], MSW, [place]

## 2015-03-06 NOTE — Progress Notes (Signed)
Pain improved from yesterday, still with leg pain on standing Able to ambulate in hall today Full strength bilateral lower extremities Normal sensation Incision c/d/i Stable and improving Continue therapy

## 2015-03-07 ENCOUNTER — Inpatient Hospital Stay (HOSPITAL_COMMUNITY): Payer: 59 | Admitting: Anesthesiology

## 2015-03-07 ENCOUNTER — Inpatient Hospital Stay (HOSPITAL_COMMUNITY): Payer: 59

## 2015-03-07 ENCOUNTER — Encounter (HOSPITAL_COMMUNITY)
Admission: EM | Disposition: A | Discharge status: Home Health | Payer: Self-pay | Source: Home / Self Care | Attending: Neurological Surgery

## 2015-03-07 ENCOUNTER — Encounter (HOSPITAL_COMMUNITY): Payer: Self-pay | Admitting: Certified Registered Nurse Anesthetist

## 2015-03-07 LAB — SURGICAL PCR SCREEN
MRSA, PCR: NEGATIVE
Staphylococcus aureus: NEGATIVE

## 2015-03-07 SURGERY — POSTERIOR LUMBAR FUSION 1 LEVEL
Anesthesia: General | Site: Spine Lumbar

## 2015-03-07 MED ORDER — VANCOMYCIN HCL 1000 MG IV SOLR
INTRAVENOUS | Status: AC
  Filled 2015-03-07: qty 1000

## 2015-03-07 MED ORDER — SODIUM CHLORIDE 0.9 % IR SOLN
Status: DC | PRN
  Administered 2015-03-07: 500 mL

## 2015-03-07 MED ORDER — ONDANSETRON HCL 4 MG/2ML IJ SOLN: 4.0000 mg | Freq: Four times a day (QID) | INTRAMUSCULAR | Status: DC | PRN

## 2015-03-07 MED ORDER — SODIUM CHLORIDE 0.9 % IV SOLN
INTRAVENOUS | Status: DC
  Administered 2015-03-07: 22:00:00 via INTRAVENOUS

## 2015-03-07 MED ORDER — MIDAZOLAM HCL 2 MG/2ML IJ SOLN
INTRAMUSCULAR | Status: AC
  Filled 2015-03-07: qty 2

## 2015-03-07 MED ORDER — BUPIVACAINE LIPOSOME 1.3 % IJ SUSP
20.0000 mL | Freq: Once | INTRAMUSCULAR | Status: DC
  Filled 2015-03-07 (×2): qty 20

## 2015-03-07 MED ORDER — PHENYLEPHRINE HCL 10 MG/ML IJ SOLN
INTRAMUSCULAR | Status: AC
  Filled 2015-03-07: qty 2

## 2015-03-07 MED ORDER — ROCURONIUM BROMIDE 100 MG/10ML IV SOLN
INTRAVENOUS | Status: DC | PRN
  Administered 2015-03-07: 40 mg via INTRAVENOUS

## 2015-03-07 MED ORDER — ROCURONIUM BROMIDE 50 MG/5ML IV SOLN
INTRAVENOUS | Status: AC
  Filled 2015-03-07: qty 5

## 2015-03-07 MED ORDER — HYDROMORPHONE 1 MG/ML IV SOLN
INTRAVENOUS | Status: AC
  Filled 2015-03-07: qty 25

## 2015-03-07 MED ORDER — DIPHENHYDRAMINE HCL 12.5 MG/5ML PO ELIX: 12.5000 mg | ORAL_SOLUTION | Freq: Four times a day (QID) | ORAL | Status: DC | PRN

## 2015-03-07 MED ORDER — 0.9 % SODIUM CHLORIDE (POUR BTL) OPTIME
TOPICAL | Status: DC | PRN
  Administered 2015-03-07: 1000 mL

## 2015-03-07 MED ORDER — DIPHENHYDRAMINE HCL 50 MG/ML IJ SOLN: 12.5000 mg | Freq: Four times a day (QID) | INTRAMUSCULAR | Status: DC | PRN

## 2015-03-07 MED ORDER — VANCOMYCIN HCL 1000 MG IV SOLR
INTRAVENOUS | Status: DC | PRN
  Administered 2015-03-07: 1000 mg via TOPICAL

## 2015-03-07 MED ORDER — MIDAZOLAM HCL 5 MG/5ML IJ SOLN
INTRAMUSCULAR | Status: DC | PRN
  Administered 2015-03-07: 2 mg via INTRAVENOUS

## 2015-03-07 MED ORDER — ONDANSETRON HCL 4 MG/2ML IJ SOLN
INTRAMUSCULAR | Status: DC | PRN
  Administered 2015-03-07: 4 mg via INTRAVENOUS

## 2015-03-07 MED ORDER — BUPIVACAINE LIPOSOME 1.3 % IJ SUSP
INTRAMUSCULAR | Status: DC | PRN
  Administered 2015-03-07: 20 mL

## 2015-03-07 MED ORDER — OXYCODONE HCL 5 MG PO TABS: 5.0000 mg | ORAL_TABLET | Freq: Once | ORAL | Status: DC | PRN

## 2015-03-07 MED ORDER — OXYCODONE HCL 5 MG/5ML PO SOLN: 5.0000 mg | Freq: Once | ORAL | Status: DC | PRN

## 2015-03-07 MED ORDER — FENTANYL CITRATE (PF) 100 MCG/2ML IJ SOLN
INTRAMUSCULAR | Status: AC
  Filled 2015-03-07: qty 2

## 2015-03-07 MED ORDER — LACTATED RINGERS IV SOLN
INTRAVENOUS | Status: DC | PRN
  Administered 2015-03-07 (×2): via INTRAVENOUS

## 2015-03-07 MED ORDER — PROMETHAZINE HCL 25 MG/ML IJ SOLN: 6.2500 mg | INTRAMUSCULAR | Status: DC | PRN

## 2015-03-07 MED ORDER — STERILE WATER FOR INJECTION IJ SOLN
INTRAMUSCULAR | Status: AC
  Filled 2015-03-07: qty 10

## 2015-03-07 MED ORDER — FENTANYL CITRATE (PF) 250 MCG/5ML IJ SOLN
INTRAMUSCULAR | Status: AC
  Filled 2015-03-07: qty 5

## 2015-03-07 MED ORDER — THROMBIN 20000 UNITS EX SOLR
CUTANEOUS | Status: DC | PRN
  Administered 2015-03-07: 20 mL via TOPICAL

## 2015-03-07 MED ORDER — DEXAMETHASONE SODIUM PHOSPHATE 4 MG/ML IJ SOLN
INTRAMUSCULAR | Status: AC
  Filled 2015-03-07: qty 1

## 2015-03-07 MED ORDER — LIDOCAINE HCL (CARDIAC) 20 MG/ML IV SOLN
INTRAVENOUS | Status: AC
  Filled 2015-03-07: qty 15

## 2015-03-07 MED ORDER — THROMBIN 5000 UNITS EX SOLR
OROMUCOSAL | Status: DC | PRN
  Administered 2015-03-07: 5 mL via TOPICAL

## 2015-03-07 MED ORDER — HYDROXYZINE HCL 25 MG PO TABS
25.0000 mg | ORAL_TABLET | Freq: Four times a day (QID) | ORAL | Status: DC | PRN
  Administered 2015-03-08 – 2015-03-10 (×3): 25 mg via ORAL
  Filled 2015-03-07 (×3): qty 1

## 2015-03-07 MED ORDER — HYDROMORPHONE 1 MG/ML IV SOLN
INTRAVENOUS | Status: DC
  Administered 2015-03-07: 20:00:00 via INTRAVENOUS

## 2015-03-07 MED ORDER — PROPOFOL 10 MG/ML IV BOLUS
INTRAVENOUS | Status: DC | PRN
  Administered 2015-03-07: 100 mg via INTRAVENOUS

## 2015-03-07 MED ORDER — FENTANYL CITRATE (PF) 100 MCG/2ML IJ SOLN
25.0000 ug | INTRAMUSCULAR | Status: DC | PRN
  Administered 2015-03-07 (×2): 50 ug via INTRAVENOUS

## 2015-03-07 MED ORDER — CEFAZOLIN SODIUM-DEXTROSE 2-3 GM-% IV SOLR
INTRAVENOUS | Status: DC | PRN
  Administered 2015-03-07: 2 g via INTRAVENOUS

## 2015-03-07 MED ORDER — GLYCOPYRROLATE 0.2 MG/ML IJ SOLN
INTRAMUSCULAR | Status: DC | PRN
  Administered 2015-03-07: 0.4 mg via INTRAVENOUS

## 2015-03-07 MED ORDER — PHENYLEPHRINE HCL 10 MG/ML IJ SOLN
INTRAMUSCULAR | Status: DC | PRN
  Administered 2015-03-07 (×2): 160 ug via INTRAVENOUS
  Administered 2015-03-07: 120 ug via INTRAVENOUS
  Administered 2015-03-07 (×2): 80 ug via INTRAVENOUS
  Administered 2015-03-07 (×2): 120 ug via INTRAVENOUS

## 2015-03-07 MED ORDER — PROPOFOL 10 MG/ML IV BOLUS
INTRAVENOUS | Status: AC
  Filled 2015-03-07: qty 20

## 2015-03-07 MED ORDER — PHENYLEPHRINE 40 MCG/ML (10ML) SYRINGE FOR IV PUSH (FOR BLOOD PRESSURE SUPPORT)
PREFILLED_SYRINGE | INTRAVENOUS | Status: AC
  Filled 2015-03-07: qty 50

## 2015-03-07 MED ORDER — NEOSTIGMINE METHYLSULFATE 10 MG/10ML IV SOLN
INTRAVENOUS | Status: DC | PRN
  Administered 2015-03-07: 3 mg via INTRAVENOUS

## 2015-03-07 MED ORDER — PHENYLEPHRINE HCL 10 MG/ML IJ SOLN
10.0000 mg | INTRAVENOUS | Status: DC | PRN
  Administered 2015-03-07: 20 ug/min via INTRAVENOUS

## 2015-03-07 MED ORDER — NALOXONE HCL 0.4 MG/ML IJ SOLN: 0.4000 mg | INTRAMUSCULAR | Status: DC | PRN

## 2015-03-07 MED ORDER — LIDOCAINE HCL (CARDIAC) 20 MG/ML IV SOLN
INTRAVENOUS | Status: DC | PRN
  Administered 2015-03-07: 50 mg via INTRAVENOUS

## 2015-03-07 MED ORDER — EPHEDRINE SULFATE 50 MG/ML IJ SOLN
INTRAMUSCULAR | Status: AC
  Filled 2015-03-07: qty 1

## 2015-03-07 MED ORDER — ONDANSETRON HCL 4 MG/2ML IJ SOLN
INTRAMUSCULAR | Status: AC
  Filled 2015-03-07: qty 4

## 2015-03-07 MED ORDER — FENTANYL CITRATE (PF) 100 MCG/2ML IJ SOLN
INTRAMUSCULAR | Status: DC | PRN
  Administered 2015-03-07: 25 ug via INTRAVENOUS
  Administered 2015-03-07 (×2): 50 ug via INTRAVENOUS
  Administered 2015-03-07: 25 ug via INTRAVENOUS
  Administered 2015-03-07: 100 ug via INTRAVENOUS

## 2015-03-07 MED ORDER — SODIUM CHLORIDE 0.9 % IJ SOLN: 9.0000 mL | INTRAMUSCULAR | Status: DC | PRN

## 2015-03-07 SURGICAL SUPPLY — 76 items
BENZOIN TINCTURE PRP APPL 2/3 (GAUZE/BANDAGES/DRESSINGS) IMPLANT
BLADE CLIPPER SURG (BLADE) IMPLANT
BUR MATCHSTICK NEURO 3.0 LAGG (BURR) ×3 IMPLANT
BUR ROUND FLUTED 5 RND (BURR) ×2 IMPLANT
BUR ROUND FLUTED 5MM RND (BURR) ×1
CANISTER SUCT 3000ML PPV (MISCELLANEOUS) ×3 IMPLANT
CHLORAPREP W/TINT 26ML (MISCELLANEOUS) IMPLANT
DECANTER SPIKE VIAL GLASS SM (MISCELLANEOUS) ×3 IMPLANT
DERMABOND ADHESIVE PROPEN (GAUZE/BANDAGES/DRESSINGS)
DERMABOND ADVANCED (GAUZE/BANDAGES/DRESSINGS) ×2
DERMABOND ADVANCED .7 DNX12 (GAUZE/BANDAGES/DRESSINGS) ×1 IMPLANT
DERMABOND ADVANCED .7 DNX6 (GAUZE/BANDAGES/DRESSINGS) IMPLANT
DRAPE C-ARM 42X72 X-RAY (DRAPES) ×3 IMPLANT
DRAPE MICROSCOPE LEICA (MISCELLANEOUS) IMPLANT
DRAPE POUCH INSTRU U-SHP 10X18 (DRAPES) ×3 IMPLANT
DRAPE SURG 17X23 STRL (DRAPES) ×3 IMPLANT
DRSG OPSITE 4X5.5 SM (GAUZE/BANDAGES/DRESSINGS) ×3 IMPLANT
DRSG OPSITE POSTOP 4X6 (GAUZE/BANDAGES/DRESSINGS) ×3 IMPLANT
ELECT REM PT RETURN 9FT ADLT (ELECTROSURGICAL) ×3
ELECTRODE REM PT RTRN 9FT ADLT (ELECTROSURGICAL) ×1 IMPLANT
EVACUATOR 1/8 PVC DRAIN (DRAIN) ×3 IMPLANT
GAUZE SPONGE 4X4 12PLY STRL (GAUZE/BANDAGES/DRESSINGS) IMPLANT
GAUZE SPONGE 4X4 16PLY XRAY LF (GAUZE/BANDAGES/DRESSINGS) IMPLANT
GLOVE BIO SURGEON STRL SZ 6.5 (GLOVE) ×2 IMPLANT
GLOVE BIO SURGEON STRL SZ7 (GLOVE) ×3 IMPLANT
GLOVE BIO SURGEON STRL SZ8 (GLOVE) ×3 IMPLANT
GLOVE BIO SURGEONS STRL SZ 6.5 (GLOVE) ×1
GLOVE BIOGEL PI IND STRL 7.5 (GLOVE) ×4 IMPLANT
GLOVE BIOGEL PI INDICATOR 7.5 (GLOVE) ×8
GLOVE EXAM NITRILE LRG STRL (GLOVE) IMPLANT
GLOVE EXAM NITRILE MD LF STRL (GLOVE) IMPLANT
GLOVE EXAM NITRILE XL STR (GLOVE) IMPLANT
GLOVE EXAM NITRILE XS STR PU (GLOVE) IMPLANT
GLOVE INDICATOR 8.5 STRL (GLOVE) ×3 IMPLANT
GLOVE SS BIOGEL STRL SZ 7 (GLOVE) ×3 IMPLANT
GLOVE SUPERSENSE BIOGEL SZ 7 (GLOVE) ×6
GLOVE SURG SS PI 7.0 STRL IVOR (GLOVE) ×9 IMPLANT
GOWN STRL REUS W/ TWL LRG LVL3 (GOWN DISPOSABLE) ×3 IMPLANT
GOWN STRL REUS W/ TWL XL LVL3 (GOWN DISPOSABLE) ×2 IMPLANT
GOWN STRL REUS W/TWL LRG LVL3 (GOWN DISPOSABLE) ×6
GOWN STRL REUS W/TWL XL LVL3 (GOWN DISPOSABLE) ×4
HEMOSTAT POWDER KIT SURGIFOAM (HEMOSTASIS) ×3 IMPLANT
KIT BASIN OR (CUSTOM PROCEDURE TRAY) ×3 IMPLANT
KIT ROOM TURNOVER OR (KITS) ×3 IMPLANT
NEEDLE HYPO 21X1.5 SAFETY (NEEDLE) ×3 IMPLANT
NEEDLE HYPO 25X1 1.5 SAFETY (NEEDLE) ×3 IMPLANT
NS IRRIG 1000ML POUR BTL (IV SOLUTION) ×3 IMPLANT
PACK LAMINECTOMY NEURO (CUSTOM PROCEDURE TRAY) ×3 IMPLANT
PACK UNIVERSAL I (CUSTOM PROCEDURE TRAY) ×3 IMPLANT
PAD ARMBOARD 7.5X6 YLW CONV (MISCELLANEOUS) ×9 IMPLANT
PATTIES SURGICAL .5X1.5 (GAUZE/BANDAGES/DRESSINGS) ×3 IMPLANT
RUBBERBAND STERILE (MISCELLANEOUS) IMPLANT
SCREW LOCK (Screw) ×6 IMPLANT
SCREW LOCK FXNS SPNE MAS PL (Screw) ×3 IMPLANT
SCREW PRECEPT POLYAXIAL 7.5X40 (Screw) ×3 IMPLANT
SCREW PRECEPT SET (Screw) ×3 IMPLANT
SCREW SHANK 6.5X40 (Screw) ×2 IMPLANT
SCREW SHANK 6.5X40 NS LF (Screw) ×1 IMPLANT
SCREW TULIP 5.5 (Screw) ×3 IMPLANT
SPONGE NEURO XRAY DETECT 1X3 (DISPOSABLE) IMPLANT
SPONGE SURGIFOAM ABS GEL 100 (HEMOSTASIS) ×3 IMPLANT
STRIP SURGICAL 1 X 6 IN (GAUZE/BANDAGES/DRESSINGS) IMPLANT
STRIP SURGICAL 1/2 X 6 IN (GAUZE/BANDAGES/DRESSINGS) IMPLANT
STRIP SURGICAL 1/4 X 6 IN (GAUZE/BANDAGES/DRESSINGS) IMPLANT
STRIP SURGICAL 3/4 X 6 IN (GAUZE/BANDAGES/DRESSINGS) IMPLANT
SUT VIC AB 0 CT1 18XCR BRD8 (SUTURE) ×2 IMPLANT
SUT VIC AB 0 CT1 8-18 (SUTURE) ×4
SUT VIC AB 2-0 CT1 18 (SUTURE) ×6 IMPLANT
SUT VIC AB 3-0 SH 8-18 (SUTURE) ×6 IMPLANT
SYR 20CC LL (SYRINGE) ×3 IMPLANT
TOWEL OR 17X24 6PK STRL BLUE (TOWEL DISPOSABLE) ×3 IMPLANT
TOWEL OR 17X26 10 PK STRL BLUE (TOWEL DISPOSABLE) ×3 IMPLANT
TRAY FOLEY W/METER SILVER 14FR (SET/KITS/TRAYS/PACK) ×3 IMPLANT
TUBE CONNECTING 12'X1/4 (SUCTIONS)
TUBE CONNECTING 12X1/4 (SUCTIONS) IMPLANT
WATER STERILE IRR 1000ML POUR (IV SOLUTION) ×3 IMPLANT

## 2015-03-07 NOTE — Anesthesia Procedure Notes (Signed)
Procedure Name: Intubation Date/Time: 03/07/2015 4:53 PM Performed by: Shirlyn Goltz Pre-anesthesia Checklist: Patient identified, Emergency Drugs available, Suction available and Patient being monitored Patient Re-evaluated:Patient Re-evaluated prior to inductionOxygen Delivery Method: Circle system utilized Preoxygenation: Pre-oxygenation with 100% oxygen Intubation Type: IV induction Ventilation: Mask ventilation without difficulty Laryngoscope Size: Mac and 3 Grade View: Grade I Tube type: Oral Tube size: 7.0 mm Number of attempts: 1 Airway Equipment and Method: Stylet Placement Confirmation: ETT inserted through vocal cords under direct vision,  positive ETCO2 and breath sounds checked- equal and bilateral Secured at: 22 cm Tube secured with: Tape Dental Injury: Teeth and Oropharynx as per pre-operative assessment

## 2015-03-07 NOTE — Progress Notes (Signed)
Awake but somnolent Moving both lower extremities well Transfer to floor after recovery from anesthesia

## 2015-03-07 NOTE — Anesthesia Preprocedure Evaluation (Addendum)
Anesthesia Evaluation  Patient identified by MRN, date of birth, ID band Patient awake    Reviewed: Allergy & Precautions, H&P , NPO status , Patient's Chart, lab work & pertinent test results  History of Anesthesia Complications Negative for: history of anesthetic complications  Airway Mallampati: II   Neck ROM: full    Dental   Pulmonary neg pulmonary ROS,    breath sounds clear to auscultation       Cardiovascular hypertension, Pt. on medications  Rhythm:regular Rate:Normal     Neuro/Psych  Headaches, Depression  Neuromuscular disease    GI/Hepatic negative GI ROS, Neg liver ROS,   Endo/Other  negative endocrine ROS  Renal/GU negative Renal ROS     Musculoskeletal  (+) Arthritis ,   Abdominal   Peds  Hematology negative hematology ROS (+)   Anesthesia Other Findings   Reproductive/Obstetrics negative OB ROS                            Anesthesia Physical Anesthesia Plan  ASA: II  Anesthesia Plan: General   Post-op Pain Management:    Induction: Intravenous  Airway Management Planned: Oral ETT  Additional Equipment: None  Intra-op Plan:   Post-operative Plan: Extubation in OR  Informed Consent: I have reviewed the patients History and Physical, chart, labs and discussed the procedure including the risks, benefits and alternatives for the proposed anesthesia with the patient or authorized representative who has indicated his/her understanding and acceptance.   Dental Advisory Given  Plan Discussed with: Anesthesiologist and CRNA  Anesthesia Plan Comments:         Anesthesia Quick Evaluation

## 2015-03-07 NOTE — Progress Notes (Signed)
I'm concerned that the L5 screws are below the originally tapped trajectory and may be irritating the L5 nerve root.  I discussed this with the patient and her husband and explained that I would like to remove the L5 screws on both sides and replace them from a more traditional pedicle screw trajectory.  We had a discussion about the risks and benefits this entails and they wish to proceed.  We will do that this afternoon.

## 2015-03-07 NOTE — Progress Notes (Signed)
Nosebleed overnight Continues to complain of leg pain with ambulation AVSS 5/5 strength all groups of lower extremities Sensation intact Incision c/d/i Not progressing as well as I would have hoped Will obtain CT lumbar spine to evaluate position of hardware Hold lovenox in case we have to return to the OR tonight

## 2015-03-07 NOTE — Transfer of Care (Signed)
Immediate Anesthesia Transfer of Care Note  Patient: Sarah Fowler  Procedure(s) Performed: Procedure(s): Revision of Lumbar four-five fixation hardware (N/A)  Patient Location: PACU  Anesthesia Type:General  Level of Consciousness: sedated, patient cooperative and responds to stimulation  Airway & Oxygen Therapy: Patient Spontanous Breathing and Patient connected to nasal cannula oxygen  Post-op Assessment: Report given to RN, Post -op Vital signs reviewed and stable and Patient moving all extremities X 4  Post vital signs: Reviewed and stable  Last Vitals:  Filed Vitals:   03/07/15 1038 03/07/15 1402  BP: 117/58 124/53  Pulse: 91 95  Temp: 37.1 C 37.4 C  Resp: 16 20    Complications: No apparent anesthesia complications

## 2015-03-07 NOTE — Progress Notes (Signed)
OT Cancellation Note  Patient Details Name: Terease Terracciano MRN: [id number] DOB: [birth year]   Cancelled Treatment:    Reason Eval/Treat Not Completed: Patient not medically ready. Noted that MD to read new CT and evaluate for further surgery. Will hold OT treatment at this time and await further instructions for ADL and mobility from MD.   Redmond Baseman, OTR/L 03/07/2015, 1:31 PM

## 2015-03-07 NOTE — Op Note (Signed)
03/03/2015 - 03/07/2015  7:39 PM  PATIENT:  Sarah Fowler  54 y.o. female  PRE-OPERATIVE DIAGNOSIS:  L5 radiculopathy bilaterally, hardware malposition  POST-OPERATIVE DIAGNOSIS:  Same  PROCEDURE:  Reposition of internal fixation hardware  SURGEON:  Cyndy Freeze, MD  ASSISTANTS: Kary Kos, MD  ANESTHESIA:   General  DRAINS: Medium Hemovac  SPECIMEN:  None  INDICATION FOR PROCEDURE: Sarah Fowler underwent L4-5 PLIF at [facility] last Wednesday.  She had severe post-operative hip and back pain.  On the third or fourth post-operative day I she developed pain radiating into both legs.  This didn't resolve and in fact progressed and I obtained a CT scan which revealed less than ideal position of her L5 screws.  It appears that they had taken an inferior trajectory relative to the tapped trajectory.  I recommended reposition of the L5 screws from a more traditional pedicle screw trajectory.  Patient understood the risks, benefits, and alternatives and potential outcomes and wished to proceed.  PROCEDURE DETAILS: After smooth induction of general endotracheal anesthesia she was turned prone on an open [place] table.  Her pressure points were appropriately padded.  The wound was prepped and draped in the usual sterile fashion.  The wound was reopened and the hardware exposed.  The set screws were removed, then the rods, then the L5 screws.  I dissected over the superior facet of L5 bilaterally to identify its junction with the transverse process.  I removed the superficial portion of each superior facet.  Palpating the pedicle from within the spinal canal and using intraoperative fluoroscopy I determined a starting point for the left L5 pedicle screw.  I scored this with a high speed burr.  I then used a pedicle probe to cannulate the appropriate trajectory. This was confirmed with AP and lateral fluoroscopy.  I tapped the pedicle and then placed an appropriate sized screw.  I repeated this on the  right.  I cut the original rods to the correct size and then connected each within the tulip heads of the screws.  I finally tightened each of the four set screws.  There was excellent hemostasis.  I irrigated copiously with bacitracin saline.  I inserted vancomycin powder into the subfascial space.  I inserted a medium hemovac drain below the fascia.  I closed the wound in layers with interrupted vicryl sutures.  I resected the skin edges to attain healthy tissue.  I closed the subcutaneous layer with 3-0 vicryls.  The skin was closed with Dermabond.  She was returned to the supine position and awakened uneventfully.  PATIENT DISPOSITION:  PACU then floor   Delay start of Pharmacological VTE agent (>24hrs) due to surgical blood loss or risk of bleeding: Yes

## 2015-03-07 NOTE — Progress Notes (Signed)
PT Cancellation Note  Patient Details Name: Sarah Fowler MRN: [id number] DOB: October 11, 1960   Cancelled Treatment:    Reason Eval/Treat Not Completed: Patient not medically ready. Noted that MD to read new CT and evaluate for need for further surgery. Will hold PT treatment at this time and await further instructions for mobility from MD.    Rolinda Roan 03/07/2015, 11:01 AM   Rolinda Roan, PT, DPT Acute Rehabilitation Services Pager: [phone]

## 2015-03-08 LAB — BASIC METABOLIC PANEL
Anion gap: 6 (ref 5–15)
BUN: 12 mg/dL (ref 6–20)
CHLORIDE: 101 mmol/L (ref 101–111)
CO2: 31 mmol/L (ref 22–32)
Calcium: 8.6 mg/dL — ABNORMAL LOW (ref 8.9–10.3)
Creatinine, Ser: 0.76 mg/dL (ref 0.44–1.00)
GFR calc non Af Amer: >60 mL/min (ref >60)
Glucose, Bld: 148 mg/dL — ABNORMAL HIGH (ref 65–99)
Potassium: 4.4 mmol/L (ref 3.5–5.1)
Sodium: 138 mmol/L (ref 135–145)

## 2015-03-08 LAB — CBC
HEMATOCRIT: 24.4 % — AB (ref 36.0–46.0)
Hemoglobin: 8.2 g/dL — ABNORMAL LOW (ref 12.0–15.0)
MCH: 32.4 pg (ref 26.0–34.0)
MCHC: 33.6 g/dL (ref 30.0–36.0)
MCV: 96.4 fL (ref 78.0–100.0)
Platelets: 235 10*3/uL (ref 150–400)
RBC: 2.53 MIL/uL — ABNORMAL LOW (ref 3.87–5.11)
RDW: 13.4 % (ref 11.5–15.5)
WBC: 8.9 10*3/uL (ref 4.0–10.5)

## 2015-03-08 MED ORDER — WHITE PETROLATUM GEL
Status: DC | PRN
  Administered 2015-03-08 – 2015-03-10 (×2): 0.2 via TOPICAL
  Filled 2015-03-08: qty 1

## 2015-03-08 NOTE — Progress Notes (Signed)
OT Cancellation Note  Patient Details Name: Sarah Fowler MRN: [id number] DOB: [birth year]   Cancelled Treatment:    Reason Eval/Treat Not Completed: Fatigue/lethargy limiting ability to participate. Pt declined to participate in therapy because she had been given medication that made her very tired. Will attempt to see later this afternoon if time allows.  Redmond Baseman, OTR/L 03/08/2015, 12:55 PM

## 2015-03-08 NOTE — Progress Notes (Signed)
Patient requested to have PCA removed. PCA removed. Oral pain medication administered at this time. Foley d/'c'ed. Patient noted more relaxed after PCA removed. Will continue to monitor.   Ave Filter, RN

## 2015-03-08 NOTE — Progress Notes (Signed)
Doing much better today AVSS Awake and alert Motor 5/5 BLE Sensation intact Incision c/d/i Stable Encourage ambulation PT/OT

## 2015-03-08 NOTE — Anesthesia Postprocedure Evaluation (Signed)
Anesthesia Post Note  Patient: Sarah Fowler  Procedure(s) Performed: Procedure(s) (LRB): Revision of Lumbar four-five fixation hardware (N/A)  Patient location during evaluation: PACU Anesthesia Type: General Level of consciousness: sedated and patient cooperative Pain management: pain level controlled Vital Signs Assessment: post-procedure vital signs reviewed and stable Respiratory status: spontaneous breathing Cardiovascular status: stable Anesthetic complications: no    Last Vitals:  Filed Vitals:   03/08/15 0400 03/08/15 0504  BP:  105/54  Pulse:  82  Temp:  37.1 C  Resp: 12 18    Last Pain:  Filed Vitals:   03/08/15 0505  PainSc: [place]

## 2015-03-08 NOTE — Progress Notes (Signed)
Offered to assist patient up in chair this AM, patient refused. Pt c/o PCA beeping and not abel to rest. RN explained that the s/e of PCA at this time. Pt request to have O2 remove but continue have PCA pump. RN advise the importance of O2 with PCA at this time. Will continue to monitor.   Ave Filter, RN

## 2015-03-08 NOTE — Progress Notes (Signed)
Physical Therapy Treatment Patient Details Name: Sarah Fowler MRN: [id number] DOB: 1960/12/19 Today's Date: 03/08/2015    History of Present Illness 54 y.o. female s/p PLIF at [facility] 03-02-15. She stayed overnight at the surgery center, then transfered to [facility] 03-03-15 for admission due to pain. Pt now s/p reposition of internal fixation hardware on 03/07/15.    PT Comments    Pt progressing towards physical therapy goals. Reports pain is improved today and was able to ambulate in the hall 200' with RW and min guard assist. Pt and husband were educated on proper positioning in chair and back precautions were reviewed. Will continue to follow.   Follow Up Recommendations  Home health PT;Supervision/Assistance - 24 hour     Equipment Recommendations  None recommended by PT    Recommendations for Other Services       Precautions / Restrictions Precautions Precautions: Fall;Back Precaution Booklet Issued: No Precaution Comments: reviewed back precautions Required Braces or Orthoses: Spinal Brace Spinal Brace: Lumbar corset;Applied in sitting position Restrictions Weight Bearing Restrictions: No    Mobility  Bed Mobility Overal bed mobility: Needs Assistance Bed Mobility: Rolling;Sidelying to Sit Rolling: Min guard Sidelying to sit: Min assist       General bed mobility comments: Pt was able to transition to EOB with cues for log roll and assist to elevate trunk to full sitting position.   Transfers Overall transfer level: Needs assistance Equipment used: Rolling walker (2 wheeled) Transfers: Sit to/from Stand Sit to Stand: Min guard         General transfer comment: VC's for hand placement on seated surface for safety. No assist required to power-up to full standing, however hands-on guarding was provided for safety and security for pt.   Ambulation/Gait Ambulation/Gait assistance: Min guard Ambulation Distance (Feet): 200 Feet Assistive device:  Rolling walker (2 wheeled) Gait Pattern/deviations: Step-through pattern;Decreased stride length;Narrow base of support Gait velocity: Decreased Gait velocity interpretation: Below normal speed for age/gender General Gait Details: Slow and guarded gait. Pt was cued for walker placement close to pt's body and improved posture.    Stairs            Wheelchair Mobility    Modified Rankin (Stroke Patients Only)       Balance Overall balance assessment: Needs assistance Sitting-balance support: Feet supported;No upper extremity supported Sitting balance-Leahy Scale: Fair     Standing balance support: No upper extremity supported;During functional activity Standing balance-Leahy Scale: Poor Standing balance comment: Posterior LOB when standing unsupported.                     Cognition Arousal/Alertness: Awake/alert Behavior During Therapy: WFL for tasks assessed/performed Overall Cognitive Status: Within Functional Limits for tasks assessed                      Exercises      General Comments        Pertinent Vitals/Pain Pain Assessment: 0-10 Pain Score: 4  Pain Location: Incision Pain Descriptors / Indicators: Operative site guarding;Discomfort Pain Intervention(s): Limited activity within patient's tolerance;Monitored during session;Repositioned    Home Living                      Prior Function            PT Goals (current goals can now be found in the care plan section) Acute Rehab PT Goals Patient Stated Goal: to go home PT Goal Formulation: With  patient Time For Goal Achievement: 03/19/15 Potential to Achieve Goals: Good Progress towards PT goals: Progressing toward goals    Frequency  Min 5X/week    PT Plan Current plan remains appropriate    Co-evaluation             End of Session Equipment Utilized During Treatment: Back brace Activity Tolerance: Patient tolerated treatment well Patient left: in chair;with  call bell/phone within reach;with family/visitor present     Time: [id number] PT Time Calculation (min) (ACUTE ONLY): 35 min  Charges:  $Gait Training: 23-37 mins                    G Codes:      Rolinda Roan 03/19/2015, 12:24 PM   Rolinda Roan, PT, DPT Acute Rehabilitation Services Pager: [phone]

## 2015-03-08 NOTE — Progress Notes (Signed)
Occupational Therapy Treatment Patient Details Name: Sarah Fowler MRN: [id number] DOB: [birth year] Today's Date: 03/08/2015    History of present illness 54 y.o. female s/p PLIF at [facility] 03-02-15. She stayed overnight at the surgery center, then transfered to [facility] 03-03-15 for admission due to pain.   OT comments  Pt progressing and education provided in session. Continue to recommend no OT follow up upon d/c.  Follow Up Recommendations  No OT follow up;Supervision/Assistance - 24 hour    Equipment Recommendations  3 in 1 bedside comode    Recommendations for Other Services      Precautions / Restrictions Precautions Precautions: Fall;Back Precaution Booklet Issued: No Precaution Comments: reviewed back precautions Required Braces or Orthoses: Spinal Brace Spinal Brace: Lumbar corset;Applied in sitting position Restrictions Weight Bearing Restrictions: No       Mobility Bed Mobility Overal bed mobility: Modified Independent                Transfers Overall transfer level: Needs assistance   Transfers: Sit to/from Stand Sit to Stand: Min guard         General transfer comment: RW in front for support    Balance    No LOB in session.                               ADL Overall ADL's : Needs assistance/impaired     Grooming: Oral care;Supervision/safety;Standing (items already at sink; cues for precautions)           Upper Body Dressing : Minimal assistance;Sitting Upper Body Dressing Details (indicate cue type and reason): assist to give pt strap on back brace Lower Body Dressing: Min guard;Sit to/from stand   Toilet Transfer: Min guard;Ambulation;RW;Grab bars (3 in 1 over commode)   Toileting- Clothing Manipulation and Hygiene: Sit to/from stand Toileting - Clothing Manipulation Details (indicate cue type and reason): pt simulated reaching behind to bottom-unsure if she could clean thoroughly without twisting;  performed clothing manipulation and wiped front area with Min guard assist.     Functional mobility during ADLs: Min guard;Rolling walker (ambulated in hallway and room) General ADL Comments: Educated on back brace (shirt underneath, no sleeping in it, back precautions while donning). Reviewed what pt could use for toilet aide for hygiene. Discussed incorporating precautions into functional activities such as placement of grooming items and use of cup for oral care. Educated on safety such as safe footwear and use of bag on walker. Instructed not to sit in chair longer than 45 minutes-1 hour maximum at a time.      Vision                     Perception     Praxis      Cognition  Awake/Alert Behavior During Therapy: WFL for tasks assessed/performed Overall Cognitive Status: Within Functional Limits for tasks assessed                       Extremity/Trunk Assessment               Exercises     Shoulder Instructions       General Comments      Pertinent Vitals/ Pain       Pain Assessment: 0-10 Pain Score: 3  Pain Location: back Pain Intervention(s): Monitored during session;Repositioned  Home Living  Prior Functioning/Environment              Frequency Min 2X/week     Progress Toward Goals  OT Goals(current goals can now be found in the care plan section)  Progress towards OT goals: Progressing toward goals  Acute Rehab OT Goals Patient Stated Goal: to go home Thursday OT Goal Formulation: With patient Time For Goal Achievement: 03/12/15 Potential to Achieve Goals: Good ADL Goals Pt Will Perform Lower Body Dressing: with set-up;with supervision;sit to/from stand;with adaptive equipment;with caregiver independent in assisting Pt Will Transfer to Toilet: with supervision;ambulating Pt Will Perform Toileting - Clothing Manipulation and hygiene: with set-up;with supervision;sit  to/from stand Additional ADL Goal #1: Pt will independently verbalize 3/3 back precautions and maintain during session.  Plan Discharge plan remains appropriate    Co-evaluation                 End of Session Equipment Utilized During Treatment: Gait belt;Rolling walker;Back brace   Activity Tolerance Patient tolerated treatment well   Patient Left in chair;with call bell/phone within reach   Nurse Communication          Time: [phone] (some time spent on toilet urinating) OT Time Calculation (min): 35 min  Charges: OT General Charges $OT Visit: 1 Procedure OT Treatments $Self Care/Home Management : 8-22 mins $Therapeutic Activity: 8-22 mins  Benito Mccreedy OTR/L [id number] 03/08/2015, 4:35 PM

## 2015-03-09 MED ORDER — CEPHALEXIN 500 MG PO CAPS
500.0000 mg | ORAL_CAPSULE | Freq: Four times a day (QID) | ORAL | Status: DC
  Administered 2015-03-09 – 2015-03-10 (×3): 500 mg via ORAL
  Filled 2015-03-09 (×3): qty 1

## 2015-03-09 NOTE — Progress Notes (Signed)
Physical Therapy Treatment Patient Details Name: Sarah Fowler MRN: [id number] DOB: 08-06-1960 Today's Date: 03/09/2015    History of Present Illness 54 y.o. female s/p PLIF at [facility] 03-02-15. She stayed overnight at the surgery center, then transfered to [facility] 03-03-15 for admission due to pain.    PT Comments    Pt progressing towards physical therapy goals. Was able to perform transfers and ambulation grossly with supervision for safety, and VC's for technique. Pt will need to practice the stairs again prior to d/c, which pt is anticipating tomorrow. Will continue to follow.   Follow Up Recommendations  Home health PT;Supervision/Assistance - 24 hour     Equipment Recommendations  None recommended by PT    Recommendations for Other Services       Precautions / Restrictions Precautions Precautions: Fall;Back Precaution Comments: Reviewed precautions during functional mobility Required Braces or Orthoses: Spinal Brace Spinal Brace: Lumbar corset;Applied in sitting position Restrictions Weight Bearing Restrictions: No    Mobility  Bed Mobility Overal bed mobility: Needs Assistance Bed Mobility: Rolling;Sit to Sidelying Rolling: Modified independent (Device/Increase time)       Sit to sidelying: Supervision General bed mobility comments: Pt was able to transition from sitting to supine with supervision for safety and cues for technique.   Transfers Overall transfer level: Needs assistance Equipment used: Rolling walker (2 wheeled) Transfers: Sit to/from Stand Sit to Stand: Supervision         General transfer comment: VC's for hand placement on seated surface for safety.   Ambulation/Gait Ambulation/Gait assistance: Supervision Ambulation Distance (Feet): 500 Feet Assistive device: Rolling walker (2 wheeled) Gait Pattern/deviations: Step-through pattern;Decreased stride length;Trunk flexed Gait velocity: Decreased Gait velocity interpretation:  Below normal speed for age/gender General Gait Details: Slow and guarded gait. Pt was cued for walker placement close to pt's body and improved posture.    Stairs Stairs: Yes Stairs assistance: Min guard Stair Management: One rail Right;Sideways Number of Stairs: 6 General stair comments: VC's for sequencing and technique. LLE weaker.  Wheelchair Mobility    Modified Rankin (Stroke Patients Only)       Balance Overall balance assessment: Needs assistance Sitting-balance support: Feet supported;No upper extremity supported Sitting balance-Leahy Scale: Good     Standing balance support: No upper extremity supported;During functional activity Standing balance-Leahy Scale: Poor Standing balance comment: Requires UE support at this time                    Cognition Arousal/Alertness: Awake/alert Behavior During Therapy: WFL for tasks assessed/performed Overall Cognitive Status: Within Functional Limits for tasks assessed                      Exercises      General Comments        Pertinent Vitals/Pain Pain Assessment: Faces Faces Pain Scale: Hurts little more Pain Location: Back Pain Descriptors / Indicators: Operative site guarding;Discomfort Pain Intervention(s): Limited activity within patient's tolerance;Monitored during session;Repositioned    Home Living                      Prior Function            PT Goals (current goals can now be found in the care plan section) Acute Rehab PT Goals Patient Stated Goal: to go home Thursday PT Goal Formulation: With patient Time For Goal Achievement: 03/19/15 Potential to Achieve Goals: Good Progress towards PT goals: Progressing toward goals    Frequency  Min 5X/week    PT Plan Current plan remains appropriate    Co-evaluation             End of Session Equipment Utilized During Treatment: Back brace Activity Tolerance: Patient tolerated treatment well Patient left: in  chair;with call bell/phone within reach;with family/visitor present     Time: [id number] PT Time Calculation (min) (ACUTE ONLY): 28 min  Charges:  $Gait Training: 23-37 mins                    G Codes:      Rolinda Roan Apr 07, 2015, 2:12 PM  Rolinda Roan, PT, DPT Acute Rehabilitation Services Pager: [phone]

## 2015-03-09 NOTE — Progress Notes (Signed)
Markedly improved pain AVSS Awake and alert Motor 5/5 throughout Stable/improved Crit 24 but normal heart rate, tolerating Continue ambulation, likely d/c tomorrow

## 2015-03-09 NOTE — Progress Notes (Signed)
Occupational Therapy Treatment/Discharge Patient Details Name: Sarah Fowler MRN: [id number] DOB: 1960-09-24 Today's Date: 03/09/2015    History of present illness 54 y.o. female s/p PLIF at [facility] 03-02-15. She stayed overnight at the surgery center, then transfered to [facility] 03-03-15 for admission due to pain.   OT comments  Pt progressing well and is adequate for d/c from OT standpoint. All education reviewed and pt has no further questions. Pt has no further acute OT needs. OT signing off.    Follow Up Recommendations  No OT follow up;Supervision/Assistance - 24 hour    Equipment Recommendations  3 in 1 bedside comode    Recommendations for Other Services      Precautions / Restrictions Precautions Precautions: Fall;Back Precaution Comments: Reviewed precautions and compensatory strategies Required Braces or Orthoses: Spinal Brace Spinal Brace: Lumbar corset;Applied in sitting position Restrictions Weight Bearing Restrictions: No       Mobility Bed Mobility Overal bed mobility: Needs Assistance Bed Mobility: Rolling Rolling: Modified independent (Device/Increase time)       Sit to sidelying: Supervision General bed mobility comments: Pt was able to transition from sitting to supine with supervision for safety and cues for technique.   Transfers Overall transfer level: Needs assistance Equipment used: Rolling walker (2 wheeled) Transfers: Sit to/from Stand Sit to Stand: Supervision         General transfer comment: VC's for hand placement on seated surface for safety.     Balance Overall balance assessment: Needs assistance Sitting-balance support: Feet supported;No upper extremity supported Sitting balance-Leahy Scale: Good     Standing balance support: No upper extremity supported;During functional activity Standing balance-Leahy Scale: Poor Standing balance comment: Requires UE support at this time                   ADL Overall  ADL's : Needs assistance/impaired                                       General ADL Comments: Reviewed back precautions, AE, compensatory strategies, and fall prevention strategies. Pt expressed concern about shower transfer to OT yesterday, but was able to practice with CNA this morning and is no longer cnocerned. Pt declined to attempt tub transfer today as she will primarily be using walk-in shower at home.       Vision                     Perception     Praxis      Cognition   Behavior During Therapy: WFL for tasks assessed/performed Overall Cognitive Status: Within Functional Limits for tasks assessed                       Extremity/Trunk Assessment               Exercises     Shoulder Instructions       General Comments      Pertinent Vitals/ Pain       Pain Assessment: 0-10 Pain Score: 3  Faces Pain Scale: Hurts little more Pain Location: back Pain Descriptors / Indicators: Operative site guarding;Discomfort Pain Intervention(s): Limited activity within patient's tolerance;Monitored during session  Home Living  Prior Functioning/Environment              Frequency       Progress Toward Goals  OT Goals(current goals can now be found in the care plan section)  Progress towards OT goals: Goals met/education completed, patient discharged from OT  Acute Rehab OT Goals Patient Stated Goal: to go home Thursday OT Goal Formulation: With patient Time For Goal Achievement: 03/12/15 Potential to Achieve Goals: Good ADL Goals Pt Will Perform Lower Body Dressing: with set-up;with supervision;sit to/from stand;with adaptive equipment;with caregiver independent in assisting Pt Will Transfer to Toilet: with supervision;ambulating Pt Will Perform Toileting - Clothing Manipulation and hygiene: with set-up;with supervision;sit to/from stand Additional ADL Goal #1: Pt  will independently verbalize 3/3 back precautions and maintain during session.  Plan All goals met and education completed, patient discharged from OT services    Co-evaluation                 End of Session Equipment Utilized During Treatment: Gait belt;Rolling walker;Back brace   Activity Tolerance Patient tolerated treatment well   Patient Left in chair;with call bell/phone within reach   Nurse Communication          Time: 0122-2411 OT Time Calculation (min): 12 min  Charges:    Redmond Baseman, OTR/L 03/09/2015, 2:45 PM

## 2015-03-09 NOTE — Care Management Note (Signed)
Case Management Note  Patient Details  Name: Sarah Fowler MRN: [id number] Date of Birth: Aug 21, 1960  Subjective/Objective:   Patient admitted from surgery center for continued pain post surgery.  Patient underwent a second surgery on Monday for revision of hardware. Patient is from home with her spouse.               Action/Plan: PT recommending HH PT. CM will continue to follow for discharge needs.   Expected Discharge Date:                  Expected Discharge Plan:  [place]  In-House Referral:     Discharge planning Services     Post Acute Care Choice:    Choice offered to:     DME Arranged:    DME Agency:     HH Arranged:    [place] Agency:     Status of Service:  In process, will continue to follow  Medicare Important Message Given:    Date Medicare IM Given:    Medicare IM give by:    Date Additional Medicare IM Given:    Additional Medicare Important Message give by:     If discussed at [place] of Stay Meetings, dates discussed:    Additional Comments:  Pollie Friar, RN 03/09/2015, 1:54 PM

## 2015-03-10 MED ORDER — METHOCARBAMOL 750 MG PO TABS: 750.0000 mg | ORAL_TABLET | Freq: Four times a day (QID) | ORAL | Status: DC

## 2015-03-10 MED ORDER — CEPHALEXIN 500 MG PO CAPS: 500.0000 mg | ORAL_CAPSULE | Freq: Four times a day (QID) | ORAL | Status: AC

## 2015-03-10 MED ORDER — OXYCODONE-ACETAMINOPHEN 5-325 MG PO TABS: 1.0000 | ORAL_TABLET | ORAL | Status: DC | PRN

## 2015-03-10 NOTE — Care Management Note (Signed)
Case Management Note  Patient Details  Name: Sarah Fowler MRN: [id number] Date of Birth: September 25, 1960  Subjective/Objective:                    Action/Plan: Patient discharged home today with home health PT orders. CM met with the patient and provided her a list of agencies in the [facility] area. She chose [facility]. CM spoke with Jeannetta Nap and they do not take [employer]. CM was able to set up home health with [facility] who takes [place]. CM called [name] and informed her of the change in home health agencies. [name] accepted the change.   Expected Discharge Date:                  Expected Discharge Plan:  [place]  In-House Referral:     Discharge planning Services  CM Consult  Post Acute Care Choice:    Choice offered to:  Patient  DME Arranged:    DME Agency:     HH Arranged:  PT HH Agency:  [facility]  Status of Service:  Completed, signed off  Medicare Important Message Given:    Date Medicare IM Given:    Medicare IM give by:    Date Additional Medicare IM Given:    Additional Medicare Important Message give by:     If discussed at [place] of Stay Meetings, dates discussed:    Additional Comments:  Pollie Friar, RN 03/10/2015, 12:03 PM

## 2015-03-10 NOTE — Discharge Summary (Signed)
Date of admission: 03/03/2015  Date of discharge: 03/10/2015  Admission diagnosis: Intractable pain after L4-5 posterior lumbar interbody fusion  Discharge diagnosis: Same, internal fixation hardware malposition  Procedure performed: Reposition of L5 internal fixation hardware  Attending: Aldean Ast, M.D.  Hospital course: Sarah Fowler was admitted to the hospital after undergoing an L4-5 posterior lumbar interbody fusion at [facility] on 03/02/2015. She is to be discharged from the surgery center the morning of December 1 but had intractable hip pain. Her x-rays showed appropriate position of her internal fixation hardware. She was transferred to [facility] for additional pain control and physical therapy. At the time she did not have any radicular symptoms. Her hip pain subsided after several days and she began to ambulate. After ambulating she developed lower extremity radicular pain bilaterally. When this did not resolve with medication and physical therapy I obtained a CT scan. This showed that her internal fixation hardware was slightly medial and inferior and could potentially be irritating the L5 nerve roots. Lucencies superior to the screws suggested that the screws had possibly shifted. I took her to the operating room and reposition the L5 screws. She had dramatic improvement of her radicular pain. She was able to ambulate comfortably in the coming days and on December 8 is stable for discharge. She is neurologically intact to both motor and sensory testing. Her incision looks good.  Follow-up: With me in 2 weeks   Discharge medications: Resume prior medications, Keflex for right upper extremity cellulitis, Percocet for pain, Robaxin for muscle spasms

## 2015-03-10 NOTE — Progress Notes (Signed)
Doing well Some itching around incision AVSS Awake and alert Motor 5/5 Sensation intact Incision c/d/i Stable Ready for d/c

## 2015-03-10 NOTE — Progress Notes (Signed)
Pt discharge education and instructions completed with pt and spouse at bedside; both voices understanding and denies any questions. Pt IV removed; back incision dsg clean, dry and intact; no stain, discharge or active bleeding noted. Pt discharge home with spouse to transport her home. Pt handed her prescriptions for percocet. Pt transported off unit via wheelchair with belongings and spouse at side. Francis Gaines Telsa Dillavou RN.

## 2015-03-15 ENCOUNTER — Emergency Department (HOSPITAL_COMMUNITY): Payer: 59

## 2015-03-15 ENCOUNTER — Emergency Department (HOSPITAL_COMMUNITY)
Admission: EM | Admit: 2015-03-15 | Discharge: 2015-03-15 | Disposition: A | Discharge status: Home / Self Care | Payer: 59 | Attending: Emergency Medicine | Admitting: Emergency Medicine

## 2015-03-15 ENCOUNTER — Encounter (HOSPITAL_COMMUNITY): Payer: Self-pay

## 2015-03-15 DIAGNOSIS — M549 Dorsalgia, unspecified: Secondary | ICD-10-CM | POA: Diagnosis present

## 2015-03-15 DIAGNOSIS — I10 Essential (primary) hypertension: Secondary | ICD-10-CM | POA: Diagnosis not present

## 2015-03-15 DIAGNOSIS — Z9889 Other specified postprocedural states: Secondary | ICD-10-CM | POA: Insufficient documentation

## 2015-03-15 DIAGNOSIS — M545 Low back pain: Secondary | ICD-10-CM | POA: Diagnosis not present

## 2015-03-15 MED ORDER — DEXAMETHASONE SODIUM PHOSPHATE 10 MG/ML IJ SOLN
10.0000 mg | Freq: Once | INTRAMUSCULAR | Status: AC
  Administered 2015-03-15: 10 mg via INTRAMUSCULAR
  Filled 2015-03-15: qty 1

## 2015-03-15 MED ORDER — METHYLPREDNISOLONE 4 MG PO TBPK: ORAL_TABLET | ORAL | Status: DC

## 2015-03-15 NOTE — ED Notes (Signed)
Pt here with c/o lower back pain that radiates down right leg. She had back surgery on November 30th. Her pain worsened and then had another surgery on December 5th by Dr. Sharmaine Base for hardware malpositioning. The pain has come back and now radiates down right leg and Dr. Sharmaine Base told her to come here for CT scan.

## 2015-03-15 NOTE — ED Provider Notes (Signed)
By signing my name below, I, Julien Nordmann, attest that this documentation has been prepared under the direction and in the presence of [employer], PA-C. Electronically Signed: Julien Nordmann, ED Scribe. 03/15/2015. 5:42 PM.   HPI: Dr. Cyndy Freeze referred the pt to the ED after reviewing CT lumbar spine and recommended that the pt receive an injection of 10 mg of Decadron and to be discharged with a medrol dose pack. She had back surgery on 11/30 and 12/5. Pt has an appointment with Dr. Cyndy Freeze on 12/20. She denies bowel/bladder incontinence.  Medications  dexamethasone (DECADRON) injection 10 mg (10 mg Intramuscular Given 03/15/15 1728)    MDM: I spoke to Dr. Johnney Killian who relayed the message from Dr. Cyndy Freeze to me. Rx: medrol dose pack  I personally performed the services described in this documentation, which was scribed in my presence. The recorded information has been reviewed and is accurate.   Ottie Glazier, PA-C 03/16/15 1744  Charlesetta Shanks, MD 03/17/15 [phone]

## 2015-03-15 NOTE — ED Notes (Signed)
Pt stable, ambulatory, states understanding of discharge instructions 

## 2015-03-15 NOTE — Discharge Instructions (Signed)

## 2015-04-03 DIAGNOSIS — R569 Unspecified convulsions: Secondary | ICD-10-CM

## 2015-04-03 HISTORY — DX: Unspecified convulsions: R56.9

## 2015-04-12 ENCOUNTER — Emergency Department (HOSPITAL_COMMUNITY): Payer: 59

## 2015-04-12 ENCOUNTER — Observation Stay (HOSPITAL_COMMUNITY): Payer: 59

## 2015-04-12 ENCOUNTER — Observation Stay (HOSPITAL_COMMUNITY)
Admission: EM | Admit: 2015-04-12 | Discharge: 2015-04-13 | Disposition: A | Discharge status: Home / Self Care | Payer: 59 | Attending: Internal Medicine | Admitting: Internal Medicine

## 2015-04-12 ENCOUNTER — Encounter (HOSPITAL_COMMUNITY): Payer: Self-pay

## 2015-04-12 DIAGNOSIS — R569 Unspecified convulsions: Secondary | ICD-10-CM | POA: Diagnosis not present

## 2015-04-12 DIAGNOSIS — Z8669 Personal history of other diseases of the nervous system and sense organs: Secondary | ICD-10-CM | POA: Diagnosis not present

## 2015-04-12 DIAGNOSIS — I10 Essential (primary) hypertension: Secondary | ICD-10-CM | POA: Diagnosis not present

## 2015-04-12 DIAGNOSIS — D72829 Elevated white blood cell count, unspecified: Secondary | ICD-10-CM | POA: Insufficient documentation

## 2015-04-12 DIAGNOSIS — E875 Hyperkalemia: Secondary | ICD-10-CM | POA: Diagnosis not present

## 2015-04-12 LAB — BASIC METABOLIC PANEL
Anion gap: 11 (ref 5–15)
BUN: 18 mg/dL (ref 6–20)
CALCIUM: 9.7 mg/dL (ref 8.9–10.3)
CO2: 27 mmol/L (ref 22–32)
Chloride: 102 mmol/L (ref 101–111)
Creatinine, Ser: 0.91 mg/dL (ref 0.44–1.00)
GFR calc Af Amer: >60 mL/min (ref >60)
Glucose, Bld: 96 mg/dL (ref 65–99)
POTASSIUM: 5.4 mmol/L — AB (ref 3.5–5.1)
SODIUM: 140 mmol/L (ref 135–145)

## 2015-04-12 LAB — CBC WITH DIFFERENTIAL/PLATELET
BASOS ABS: 0.1 10*3/uL (ref 0.0–0.1)
BASOS PCT: 0 %
EOS ABS: 0.3 10*3/uL (ref 0.0–0.7)
EOS PCT: 2 %
HCT: 38.6 % (ref 36.0–46.0)
Hemoglobin: 12.7 g/dL (ref 12.0–15.0)
LYMPHS PCT: 15 %
Lymphs Abs: 1.8 10*3/uL (ref 0.7–4.0)
MCH: 31.5 pg (ref 26.0–34.0)
MCHC: 32.9 g/dL (ref 30.0–36.0)
MCV: 95.8 fL (ref 78.0–100.0)
Monocytes Absolute: 0.9 10*3/uL (ref 0.1–1.0)
Monocytes Relative: 7 %
Neutro Abs: 9.2 10*3/uL — ABNORMAL HIGH (ref 1.7–7.7)
Neutrophils Relative %: 76 %
PLATELETS: 321 10*3/uL (ref 150–400)
RBC: 4.03 MIL/uL (ref 3.87–5.11)
RDW: 13.6 % (ref 11.5–15.5)
WBC: 12.2 10*3/uL — AB (ref 4.0–10.5)

## 2015-04-12 LAB — URINALYSIS, ROUTINE W REFLEX MICROSCOPIC
BILIRUBIN URINE: NEGATIVE
Glucose, UA: NEGATIVE mg/dL
Hgb urine dipstick: NEGATIVE
KETONES UR: NEGATIVE mg/dL
NITRITE: NEGATIVE
PROTEIN: NEGATIVE mg/dL
Specific Gravity, Urine: 1.012 (ref 1.005–1.030)
pH: 8 (ref 5.0–8.0)

## 2015-04-12 LAB — URINE MICROSCOPIC-ADD ON

## 2015-04-12 LAB — MAGNESIUM: MAGNESIUM: 1.8 mg/dL (ref 1.7–2.4)

## 2015-04-12 LAB — POTASSIUM: POTASSIUM: 4.3 mmol/L (ref 3.5–5.1)

## 2015-04-12 LAB — PHOSPHORUS: Phosphorus: 3.5 mg/dL (ref 2.5–4.6)

## 2015-04-12 MED ORDER — LORAZEPAM 2 MG/ML IJ SOLN
0.5000 mg | Freq: Once | INTRAMUSCULAR | Status: AC
Start: 2015-04-12 — End: 2015-04-12
  Administered 2015-04-12: 0.5 mg via INTRAVENOUS
  Filled 2015-04-12: qty 1

## 2015-04-12 MED ORDER — VITAMIN D 1000 UNITS PO TABS
1000.0000 [IU] | ORAL_TABLET | Freq: Every day | ORAL | Status: DC
  Administered 2015-04-12 – 2015-04-13 (×2): 1000 [IU] via ORAL
  Filled 2015-04-12 (×2): qty 1

## 2015-04-12 MED ORDER — SODIUM CHLORIDE 0.9 % IV BOLUS (SEPSIS)
1000.0000 mL | Freq: Once | INTRAVENOUS | Status: AC
  Administered 2015-04-12: 1000 mL via INTRAVENOUS

## 2015-04-12 MED ORDER — CARISOPRODOL 350 MG PO TABS: 350.0000 mg | ORAL_TABLET | Freq: Four times a day (QID) | ORAL | Status: DC | PRN

## 2015-04-12 MED ORDER — METOCLOPRAMIDE HCL 5 MG/ML IJ SOLN
10.0000 mg | Freq: Once | INTRAMUSCULAR | Status: AC
  Administered 2015-04-12: 10 mg via INTRAVENOUS
  Filled 2015-04-12: qty 2

## 2015-04-12 MED ORDER — ONDANSETRON HCL 4 MG PO TABS: 4.0000 mg | ORAL_TABLET | Freq: Four times a day (QID) | ORAL | Status: DC | PRN

## 2015-04-12 MED ORDER — HYDROMORPHONE HCL 1 MG/ML IJ SOLN
1.0000 mg | Freq: Once | INTRAMUSCULAR | Status: AC
  Administered 2015-04-12: 1 mg via INTRAVENOUS
  Filled 2015-04-12: qty 1

## 2015-04-12 MED ORDER — KETOROLAC TROMETHAMINE 30 MG/ML IJ SOLN
30.0000 mg | Freq: Once | INTRAMUSCULAR | Status: DC
  Filled 2015-04-12: qty 1

## 2015-04-12 MED ORDER — GADOBENATE DIMEGLUMINE 529 MG/ML IV SOLN
15.0000 mL | Freq: Once | INTRAVENOUS | Status: AC | PRN
  Administered 2015-04-12: 15 mL via INTRAVENOUS

## 2015-04-12 MED ORDER — VERAPAMIL HCL ER 120 MG PO TBCR
120.0000 mg | EXTENDED_RELEASE_TABLET | Freq: Every day | ORAL | Status: DC
  Administered 2015-04-12: 120 mg via ORAL
  Filled 2015-04-12 (×3): qty 1

## 2015-04-12 MED ORDER — ACETAMINOPHEN 325 MG PO TABS: 650.0000 mg | ORAL_TABLET | Freq: Four times a day (QID) | ORAL | Status: DC | PRN

## 2015-04-12 MED ORDER — SUMATRIPTAN SUCCINATE 50 MG PO TABS: 50.0000 mg | ORAL_TABLET | ORAL | Status: DC | PRN

## 2015-04-12 MED ORDER — DIPHENHYDRAMINE HCL 50 MG/ML IJ SOLN
25.0000 mg | Freq: Once | INTRAMUSCULAR | Status: AC
  Administered 2015-04-12: 25 mg via INTRAVENOUS
  Filled 2015-04-12: qty 1

## 2015-04-12 MED ORDER — GABAPENTIN 300 MG PO CAPS
600.0000 mg | ORAL_CAPSULE | Freq: Every day | ORAL | Status: DC
  Administered 2015-04-12: 600 mg via ORAL
  Filled 2015-04-12: qty 2

## 2015-04-12 MED ORDER — SODIUM CHLORIDE 0.9 % IJ SOLN
3.0000 mL | Freq: Two times a day (BID) | INTRAMUSCULAR | Status: DC
  Administered 2015-04-12: 3 mL via INTRAVENOUS

## 2015-04-12 MED ORDER — MONTELUKAST SODIUM 10 MG PO TABS
10.0000 mg | ORAL_TABLET | Freq: Every day | ORAL | Status: DC
  Administered 2015-04-12: 10 mg via ORAL
  Filled 2015-04-12: qty 1

## 2015-04-12 MED ORDER — ACETAMINOPHEN 650 MG RE SUPP: 650.0000 mg | Freq: Four times a day (QID) | RECTAL | Status: DC | PRN

## 2015-04-12 MED ORDER — ONDANSETRON HCL 4 MG/2ML IJ SOLN: 4.0000 mg | Freq: Four times a day (QID) | INTRAMUSCULAR | Status: DC | PRN

## 2015-04-12 NOTE — ED Notes (Signed)
Pt's husband reports that patient is not able to have any artificial sweeteners, Dyes, or flavoring. Pt's husband is adamant that everyone knows this. Spoke with husband about patient's care. Husband reports that patient was here for eight days about a month ago and had blistering from the blood pressure cuff. Stated that she would not leave this on. This RN made husband aware that we would leave the BP off and we would come in to take patient's BP. Reported being worried patient had not eaten all day. After swallow screen, made aware that she could eat, but that her CBG was normal. Asking about pain medicine, made aware with seizure patients and allergies that staff would have to evaluate administration of pain medicine. Husband verbalized understanding. This RN Reassured patient and family of plan of care.

## 2015-04-12 NOTE — Progress Notes (Signed)
EEG Completed; Results Pending  

## 2015-04-12 NOTE — ED Notes (Signed)
MD Diddy at the bedside

## 2015-04-12 NOTE — ED Notes (Signed)
Called MRI. Pt on her way back to the ED

## 2015-04-12 NOTE — ED Notes (Signed)
Pt has not returned from EEG.

## 2015-04-12 NOTE — Procedures (Signed)
ELECTROENCEPHALOGRAM REPORT  Patient: Sarah Fowler       Room #: ED-E45 EEG No. ID: [id number] Age: 55 y.o.        Sex: female Referring Physician: Remus Blake Report Date:  04/12/2015        Interpreting Physician: Anthony Sar  History: Sarah Fowler is an 55 y.o. female with a history of migraine headaches, hypertension and status post lumbar laminectomy for L5 radiculopathy, who experienced a witnessed generalized seizure with focal motor onset earlier today. Patient has no history of seizure disorder.  Indications for study:  Rule out seizure disorder.  Technique: This is an 18 channel routine scalp EEG performed at the bedside with bipolar and monopolar montages arranged in accordance to the international 10/20 system of electrode placement.   Description: This EEG recording was performed during wakefulness and during light sleep. Predominant background activity during wakefulness consisted of moderate amplitude diffuse mixed delta and theta activity which was symmetrical. Rhythmic 7-8 Hz moderate amplitude activity was recorded from the posterior head region. Photic stimulation was performed. However, no clear occipital driving response was detected. There was increased slowing of background activity during light sleep as well as increased activity amplitude. Symmetrical sleep spindles and arousal responses were recorded. No epileptiform discharges were recorded during wakefulness nor during sleep.  Interpretation: This EEG is abnormal with mild generalized nonspecific slowing of cerebral activity which can be seen with metabolic and toxic encephalopathies as well as with degenerative CNS disorders and postictal state following seizure activity. No epileptiform activity was recorded during this study. However, the absence of epileptiform activity during an EEG recording does not necessarily rule out seizure disorder.   Rush Farmer M.D. [facility] [phone]
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
## 2015-04-12 NOTE — ED Notes (Signed)
Hospitalist at the bedside 

## 2015-04-12 NOTE — Progress Notes (Signed)
Patient admitted to 5M16. Patient and family updated on plan of care, vocalized understanding. Patient states pain is "4/10" in lower back. Patient wears soft lumbar brace when walking and sitting up. Patient ambulated with 1 assist to restroom, voided, with stand by assist with walker and brace. Patient's family updated RN on dietary restrictions, patient and patient's husband states that patient cannot have artificial sweetners or flavors, no diet soda, no desserts at this facility. Incision from prior back surgery is open to air, closed, clean, dry, intact. Diet order updated per family request. Patient given menu to order dinner. Patient vocalized understanding of fall policy. Bed alarm on, safety measures in place, seizure precautions in place. Will continue to monitor closely.
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
## 2015-04-12 NOTE — ED Notes (Signed)
Pt resting at this time. Eyes closed and remains on the cardiac monitor

## 2015-04-12 NOTE — ED Notes (Signed)
Patient to be transported upstairs by this RN after EEG

## 2015-04-12 NOTE — ED Notes (Signed)
Phlebotomy at the bedside  
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
## 2015-04-12 NOTE — ED Notes (Signed)
PA at the bedside.

## 2015-04-12 NOTE — ED Notes (Signed)
Pt placed on transport monitor and transported upstairs from MRI by this RN

## 2015-04-12 NOTE — ED Notes (Signed)
Robyn, EMT walked patient's family upstairs to [address]

## 2015-04-12 NOTE — ED Notes (Signed)
Spoke with MD Diddy about patient's care. Recommended to run a UA on the patient

## 2015-04-12 NOTE — ED Notes (Signed)
MD Kirkpatrick at the bedside.  

## 2015-04-12 NOTE — ED Notes (Addendum)
Per EMS, PT is coming from neuro surgery clinic across the street when the patient had a grand mal seizure in the wheelchair. Seizure lasted for longer than sixty seconds. Pt was post-ictal when EMS arrived. Pt was confused and could not recognize husband. During transport, pt reported lower back pain. No Hx of the same. Vitals per EMS: 128/85, 165 CBG, 97% on RA, 116 ST.

## 2015-04-13 DIAGNOSIS — R569 Unspecified convulsions: Secondary | ICD-10-CM | POA: Diagnosis not present

## 2015-04-13 DIAGNOSIS — Z8669 Personal history of other diseases of the nervous system and sense organs: Secondary | ICD-10-CM

## 2015-04-13 LAB — BASIC METABOLIC PANEL
ANION GAP: 7 (ref 5–15)
BUN: 13 mg/dL (ref 6–20)
CALCIUM: 9.5 mg/dL (ref 8.9–10.3)
CO2: 29 mmol/L (ref 22–32)
CREATININE: 0.82 mg/dL (ref 0.44–1.00)
Chloride: 103 mmol/L (ref 101–111)
Glucose, Bld: 96 mg/dL (ref 65–99)
Potassium: 4.3 mmol/L (ref 3.5–5.1)
SODIUM: 139 mmol/L (ref 135–145)

## 2015-04-13 LAB — CBC
HCT: 39.2 % (ref 36.0–46.0)
Hemoglobin: 12.9 g/dL (ref 12.0–15.0)
MCH: 31.6 pg (ref 26.0–34.0)
MCHC: 32.9 g/dL (ref 30.0–36.0)
MCV: 96.1 fL (ref 78.0–100.0)
PLATELETS: 331 10*3/uL (ref 150–400)
RBC: 4.08 MIL/uL (ref 3.87–5.11)
RDW: 14.2 % (ref 11.5–15.5)
WBC: 8.5 10*3/uL (ref 4.0–10.5)

## 2015-04-13 NOTE — Progress Notes (Signed)
Subjective: No further events.   Exam: Filed Vitals:   04/13/15 0547 04/13/15 0937  BP: 105/54 109/53  Pulse: 80 90  Temp: 98.1 F (36.7 C) 98.2 F (36.8 C)  Resp: 18 18   Gen: In bed, NAD Resp: non-labored breathing, no acute distress Abd: soft, nt  Neuro: MS: awake, alert, interactive and appropriate [id number], face symmetric Motor: MAEW Sensory:intact to LT  Pertinent Labs: Bmp - unremarkable  Impression: 55 yo F with a first unprovoked seizure. Th[place]uh there was some component to the story concerning for possible non-epiloleptic event, the fact that she had a leukocytosis after and that it was witnessed make me feel that we should consider this a first unprovoked seizure. She has no findings on MRI or EEG suggestive of predisposition and therefore I would not recommend starting an AED at this time.   Recommendations: 1) Patient is unable to drive, operate heavy machinery, perform activities at heights or participate in water activities until release by outpatient physician. This was discussed with the patient who expressed understanding.  2) Follow up with neurology as an outpatient.    Roland Rack, MD [facility] [phone]  If 7pm- 7am, please page neurology on call as listed in [place].
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
## 2015-04-13 NOTE — Discharge Instructions (Signed)
Do not drive, operating heavy machinery, perform activities at heights, swimming or participation in water activities or provide baby sitting services until you have seen by Primary MD or a Neurologist and advised to do so again.  Follow with Primary MD Saralyn Pilar, MD in 7 days   Get CBC, CMP, 2 view Chest X ray checked  by Primary MD next visit.    Activity: Wear Back brace as before when out of the bed. Full fall precautions use walker/cane & assistance as needed   Disposition Home     Diet:   Heart Healthy    For Heart failure patients - Check your Weight same time everyday, if you gain over 2 pounds, or you develop in leg swelling, experience more shortness of breath or chest pain, call your Primary MD immediately. Follow Cardiac Low Salt Diet and 1.5 lit/day fluid restriction.   On your next visit with your primary care physician please Get Medicines reviewed and adjusted.   Please request your Prim.MD to go over all Hospital Tests and Procedure/Radiological results at the follow up, please get all Hospital records sent to your Prim MD by signing hospital release before you go home.   If you experience worsening of your admission symptoms, develop shortness of breath, life threatening emergency, suicidal or homicidal thoughts you must seek medical attention immediately by calling 911 or calling your MD immediately  if symptoms less severe.  You Must read complete instructions/literature along with all the possible adverse reactions/side effects for all the Medicines you take and that have been prescribed to you. Take any new Medicines after you have completely understood and accpet all the possible adverse reactions/side effects.   Do not drive when taking Pain medications.    Do not take more than prescribed Pain, Sleep and Anxiety Medications  Special Instructions: If you have smoked or chewed Tobacco  in the last 2 yrs please stop smoking, stop any regular Alcohol   and or any Recreational drug use.  Wear Seat belts while driving.   Please note  You were cared for by a hospitalist during your hospital stay. If you have any questions about your discharge medications or the care you received while you were in the hospital after you are discharged, you can call the unit and asked to speak with the hospitalist on call if the hospitalist that took care of you is not available. Once you are discharged, your primary care physician will handle any further medical issues. Please note that NO REFILLS for any discharge medications will be authorized once you are discharged, as it is imperative that you return to your primary care physician (or establish a relationship with a primary care physician if you do not have one) for your aftercare needs so that they can reassess your need for medications and monitor your lab values.

## 2015-04-13 NOTE — Care Management Note (Signed)
Case Management Note  Patient Details  Name: Sarah Fowler MRN: [id number] Date of Birth: [birth year]  Subjective/Objective:                    Action/Plan: Patient admitted with seizure activity and had negative MRI and EEG. Plan is to discharge home with her husband. No further needs per CM.   Expected Discharge Date:                  Expected Discharge Plan:  Home/Self Care  In-House Referral:     Discharge planning Services     Post Acute Care Choice:    Choice offered to:     DME Arranged:    DME Agency:     HH Arranged:    [place] Agency:     Status of Service:  Completed, signed off  Medicare Important Message Given:    Date Medicare IM Given:    Medicare IM give by:    Date Additional Medicare IM Given:    Additional Medicare Important Message give by:     If discussed at [place] of Stay Meetings, dates discussed:    Additional Comments:  Pollie Friar, RN 04/13/2015, 11:23 AM

## 2015-04-13 NOTE — Progress Notes (Signed)
Pt discharged from hospital per orders from MD. Pt and spouse were educated on discharge instructions. All questions and concerns were addressed by RN. Notes from MD were also emphasized pertaining to patient not operating any motorized vehicle for the next six weeks. Patient and spouse verbalized understanding. Pt's IV was removed before discharge and exited hospital via wheelchair.

## 2015-04-20 ENCOUNTER — Ambulatory Visit: Payer: PRIVATE HEALTH INSURANCE | Admitting: Neurology

## 2015-05-18 ENCOUNTER — Ambulatory Visit (INDEPENDENT_AMBULATORY_CARE_PROVIDER_SITE_OTHER): Payer: 59 | Admitting: Certified Nurse Midwife

## 2015-05-18 ENCOUNTER — Encounter: Payer: Self-pay | Admitting: Certified Nurse Midwife

## 2015-05-18 VITALS — BP 110/78 | HR 70 | Resp 16 | Ht 66.0 in | Wt 164.0 lb

## 2015-05-18 DIAGNOSIS — Z01419 Encounter for gynecological examination (general) (routine) without abnormal findings: Secondary | ICD-10-CM

## 2015-05-18 NOTE — Progress Notes (Signed)
Encounter reviewed Sharaya Boruff, MD   

## 2015-05-18 NOTE — Patient Instructions (Signed)
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
## 2015-07-06 NOTE — Addendum Note (Signed)
Addended by: Regina Eck on: 07/06/2015 01:45 PM   Modules accepted: Miquel Dunn

## 2015-10-26 ENCOUNTER — Encounter: Payer: Self-pay | Admitting: Certified Nurse Midwife

## 2015-10-26 ENCOUNTER — Ambulatory Visit (INDEPENDENT_AMBULATORY_CARE_PROVIDER_SITE_OTHER): Payer: 59 | Admitting: Certified Nurse Midwife

## 2015-10-26 ENCOUNTER — Telehealth: Payer: Self-pay | Admitting: Certified Nurse Midwife

## 2015-10-26 VITALS — BP 110/70 | HR 70 | Resp 16 | Ht 66.0 in | Wt 163.0 lb

## 2015-10-26 DIAGNOSIS — N8110 Cystocele, unspecified: Secondary | ICD-10-CM | POA: Diagnosis not present

## 2015-10-26 DIAGNOSIS — IMO0001 Reserved for inherently not codable concepts without codable children: Secondary | ICD-10-CM

## 2015-10-26 DIAGNOSIS — IMO0002 Reserved for concepts with insufficient information to code with codable children: Secondary | ICD-10-CM

## 2015-10-26 DIAGNOSIS — Z4689 Encounter for fitting and adjustment of other specified devices: Secondary | ICD-10-CM

## 2015-10-26 NOTE — Patient Instructions (Signed)

## 2015-10-26 NOTE — Telephone Encounter (Signed)
Patient called to schedule an appointment for a pessary fitting with Melvia Heaps, CNM. Patient said that she discussed this option with Melvia Heaps already. Scheduled appointment 10/26/15 at 2:00pm with Melvia Heaps, CNM. Patient does not request a return call unless you have questions.

## 2015-10-26 NOTE — Telephone Encounter (Signed)
Patient okay for pessary fitting as scheduled with Melvia Heaps CNM.  Routing to [employer] CNM and will close.

## 2015-10-26 NOTE — Progress Notes (Signed)
55 y.o. Married Caucasian female [phone] here with complaint of bladder prolapse. Had vaginal hysterectomy in 2006 for endometriosis. Denies vaginal bleeding or vaginal dryness or discharge. Here today to be evaluated for possible pessary use for support. Has noted more pressure with bulging noted with squatting or standing for prolonged period of time. No problems with bowel movements or urinary incontinence or loss of urine.. No other health concerns today.   O: Healthy WD,WN female  Affect: normal  Orientation x 3  She has been counseled about other options including pelvic physical therapy, surgical intervention, as well as doing nothing.  She has decided to proceed with pessary use and is here for fitting today.    Patient is sexually active. Urine culture has not been performed but collected today.  .  Exam: BP 110/70   Pulse 70   Resp 16   Ht 5\' 6"  (1.676 m)   Wt 163 lb (73.9 kg)   LMP 08/31/2004   BMI 26.31 kg/m    General appearance: alert, cooperative, appears stated age and no distress No inguinal lymph node enlargement or tenderness   Pelvic: External genitalia:  no lesions, normal escutcheon and slight atrophy noted              Urethra:  normal appearing urethra with no masses, tenderness or lesions Bladder not tender, urethral meatus non tender              Bartholins and Skenes: Bartholin's,  Skene's normal                 Vagina: normal appearing vagina with normal color and discharge, no lesions, grade 2-3 cystocele, at introitus with bearing down              Cervix: removed surgically Bimanual Exam:  Uterus:  uterus is normal size, shape, consistency and nontender, surgically absent, vaginal cuff well healed                               Adnexa:    normal adnexa in size, nontender and no masses                               Anus:  normal sphincter tone, no lesions  Procedure:  Patient fitted with the following pessary and sizes:  Size 3 pessary with knob, and  size 3 incontinence dish with support, # 3 pessary with support and #4 pessary with support and Gelhorn 2 3/4"..  After each fitting, patient was advised to redress, ambulate, and attempt to void.  The best fit was with 2 3/4 " Gellhorn pessary. Patient ambulated and pessary came down and patient removed prior to voiding. Discussed with patient she is not a pessary candidate. Discussed possible surgical option and consult with Dr. Quincy Simmonds. Questions answered.   Patient had no problems voiding and urine for culture was collected.  Assessment:Normal pelvic exam   Grade 3 cystocele symptomatic previous TVH . No contraindication for pessary use but unable to fit with good support and able to keep in vagina.   Plan: Will discuss patient with Dr. Quincy Simmonds and a consult will be set for patient for evaluation and discuss surgical option for repair.. Patient agreeable to plan. Questions addressed. Encouraged patient to avoid prolong standing or straining if possible. Lab: Urine culture  Rv as above

## 2015-10-27 NOTE — Progress Notes (Signed)
Notify patient that I have discussed her problem with Dr. Quincy Simmonds. Please schedule her for a consult regarding prolapse with Dr. Quincy Simmonds.

## 2015-10-27 NOTE — Progress Notes (Signed)
appt Wednesday 11-02-15 at 2:30pm with Dr Quincy Simmonds

## 2015-10-27 NOTE — Progress Notes (Signed)
Encounter reviewed by Dr. Amberle Lyter Amundson C. Silva.  

## 2015-11-02 ENCOUNTER — Encounter: Payer: Self-pay | Admitting: Obstetrics and Gynecology

## 2015-11-02 ENCOUNTER — Ambulatory Visit (INDEPENDENT_AMBULATORY_CARE_PROVIDER_SITE_OTHER): Payer: 59 | Admitting: Obstetrics and Gynecology

## 2015-11-02 VITALS — BP 138/84 | HR 88 | Ht 66.0 in | Wt 164.2 lb

## 2015-11-02 DIAGNOSIS — IMO0002 Reserved for concepts with insufficient information to code with codable children: Secondary | ICD-10-CM

## 2015-11-02 DIAGNOSIS — N8110 Cystocele, unspecified: Secondary | ICD-10-CM

## 2015-11-02 DIAGNOSIS — N993 Prolapse of vaginal vault after hysterectomy: Secondary | ICD-10-CM | POA: Diagnosis not present

## 2015-11-02 DIAGNOSIS — N393 Stress incontinence (female) (male): Secondary | ICD-10-CM | POA: Diagnosis not present

## 2015-11-02 DIAGNOSIS — N816 Rectocele: Secondary | ICD-10-CM
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
## 2015-11-04 LAB — URINE CULTURE: Organism ID, Bacteria: 10000

## 2015-11-08 ENCOUNTER — Encounter: Payer: Self-pay | Admitting: Obstetrics and Gynecology

## 2015-11-10 ENCOUNTER — Telehealth: Payer: Self-pay | Admitting: Obstetrics and Gynecology

## 2015-11-10 NOTE — Telephone Encounter (Signed)
Patient is returning your call.  

## 2015-11-10 NOTE — Telephone Encounter (Signed)
Patient returned call. She said to call her back at her work number if it is before 5 PM, after that call her at her mobile number.

## 2015-11-10 NOTE — Telephone Encounter (Signed)
Spoke with pt regarding benefit for urodynamic testing. Patient understood and agreeable. Patient ready to schedule.   Routing to [employer] for scheduling

## 2015-11-10 NOTE — Telephone Encounter (Signed)
Return call to patient. Discussion of urodynamics procedure provided and patient desires to proceed. Appointment scheduled for 11-23-15 at 1100.   Routing to provider for final review. Patient agreeable to disposition. Will close encounter.

## 2015-11-10 NOTE — Telephone Encounter (Signed)
Call to patient, left message to call back. 

## 2015-11-17 ENCOUNTER — Ambulatory Visit (INDEPENDENT_AMBULATORY_CARE_PROVIDER_SITE_OTHER): Payer: 59

## 2015-11-17 VITALS — BP 114/72 | HR 70 | Resp 14 | Ht 66.0 in | Wt 161.6 lb

## 2015-11-17 DIAGNOSIS — N393 Stress incontinence (female) (male): Secondary | ICD-10-CM | POA: Diagnosis not present

## 2015-11-17 LAB — POCT URINALYSIS DIPSTICK
Bilirubin, UA: NEGATIVE
Blood, UA: NEGATIVE
GLUCOSE UA: NEGATIVE
KETONES UA: NEGATIVE
Leukocytes, UA: NEGATIVE
Nitrite, UA: NEGATIVE
Protein, UA: NEGATIVE
UROBILINOGEN UA: NEGATIVE

## 2015-11-17 NOTE — Progress Notes (Signed)
Patient here for pre procedure u/a. Patient has Urodynamics scheduled for 11/23/15.   Patient has no complaints.   Encounter routed to provider. Will close encounter.

## 2015-11-23 ENCOUNTER — Ambulatory Visit (INDEPENDENT_AMBULATORY_CARE_PROVIDER_SITE_OTHER): Payer: 59

## 2015-11-23 DIAGNOSIS — N393 Stress incontinence (female) (male): Secondary | ICD-10-CM

## 2015-11-23 DIAGNOSIS — IMO0002 Reserved for concepts with insufficient information to code with codable children: Secondary | ICD-10-CM

## 2015-11-23 DIAGNOSIS — N8110 Cystocele, unspecified: Secondary | ICD-10-CM

## 2015-11-23 NOTE — Progress Notes (Signed)
Sarah Fowler is a 55 y.o. female Who presents today for urodynamics testing, ordered by Dr. Quincy Simmonds.   Allergies and medications reviewed.  Denies complaints today. No urinary complaints.   Urine Micro exam: negative for WBC's or RBC's, okay to proceed per Dr. Quincy Simmonds.  Patient reports urinary leakage with coughing, sneezing, exercise.   Urodynamics testing initiated. Lumax Bladder Catheter #10 [place] and lumax Abdominal Catheter #10 [place].   Post void residual 180 ml. (40 minutes after initial void.)  Allergy to tape noted. Patient states blistering and itching several hours later. Dr. Quincy Simmonds came in and spoke to patient. Gave choices of continuing urodynamics or to return to office for a simple cystometric procedure. Patient agreeable to continue with urodynamics testing using paper tape. Patient states "she should be fine." Dr. Quincy Simmonds advised patient to take Benadryl orally or cream when she leaves office. Patient stated "cream works better."    Urethral catheter placed without issue. Rectal catheter placed without issue.   Urodynamics testing completed. All tape removed. No skin break down noted. Adhesive remover applied to skin as well as cleansing with warm water. Please see scanned Patient summary report in Epic. Procedure completed and patient tolerated well without complaints. Patient scheduled for follow up office visit with Dr. Quincy Simmonds to discuss results on 12/09/2015. Patient agreeable.   Patient given post procedure instructions:  You may have a mild bladder and rectal discomfort for a few hours after the test. You may experience some frequent urination and slight burning the first few times you urinate after the test. Rarely, the urine may be blood tinged. These are both due to catheter placements and resolve quickly. You should call our office immediately if you have signs of infection, which may include bladder pain, urinary urgency, fever, or burning during urination. We do encourage you  to drink plenty of water after the test.

## 2015-11-25 ENCOUNTER — Telehealth: Payer: Self-pay | Admitting: Obstetrics and Gynecology

## 2015-11-25 NOTE — Progress Notes (Signed)
Multichannel urodynamics with reduction of prolapse with pessary ring with support - #3  Uroflow - void 949 cc, PVR 180 cc. Not continuous. CMG - stable. S1 750 cc.  S2 80 cc. S3 850 cc.  Large bladder volumes. No leakage.   UPP - 24 cm H2O.  Pressure flow - looks to be about 40 cm H2O.  Straining with voiding .   No documented stress incontinence.  Voiding dysfunction.  I suspect this is due to her prolapse.

## 2015-11-25 NOTE — Telephone Encounter (Signed)
Patient had general questions about planning for surgery and FMLA form completion. Questions answered. Patient thought she was to get FMLA forms before surgery was scheduled. Clarified these cannot be completed until surgery date is scheduled. Patietn has consult to discuss results of urodynamic testing on 12-09-15.  Routing to provider for final review. Patient agreeable to disposition. Will close encounter.

## 2015-11-25 NOTE — Telephone Encounter (Signed)
Patient said "Gay Filler told me to call her if I had anymore questions.".

## 2015-12-07 ENCOUNTER — Encounter: Payer: Self-pay | Admitting: Obstetrics and Gynecology

## 2015-12-09 ENCOUNTER — Encounter: Payer: Self-pay | Admitting: Obstetrics and Gynecology

## 2015-12-09 ENCOUNTER — Ambulatory Visit (INDEPENDENT_AMBULATORY_CARE_PROVIDER_SITE_OTHER): Payer: 59 | Admitting: Obstetrics and Gynecology

## 2015-12-09 VITALS — BP 116/74 | HR 68 | Resp 14 | Ht 66.0 in | Wt 160.8 lb

## 2015-12-09 DIAGNOSIS — N816 Rectocele: Secondary | ICD-10-CM | POA: Diagnosis not present

## 2015-12-09 DIAGNOSIS — N8110 Cystocele, unspecified: Secondary | ICD-10-CM | POA: Diagnosis not present

## 2015-12-09 DIAGNOSIS — N993 Prolapse of vaginal vault after hysterectomy: Secondary | ICD-10-CM

## 2015-12-09 DIAGNOSIS — IMO0002 Reserved for concepts with insufficient information to code with codable children: Secondary | ICD-10-CM

## 2015-12-09 DIAGNOSIS — N393 Stress incontinence (female) (male): Secondary | ICD-10-CM
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
## 2015-12-15 ENCOUNTER — Encounter: Payer: Self-pay | Admitting: Obstetrics and Gynecology

## 2015-12-16 ENCOUNTER — Telehealth: Payer: Self-pay | Admitting: Obstetrics and Gynecology

## 2015-12-16 NOTE — Telephone Encounter (Signed)
Called patient to review benefits for a recommended procedure. Left Voicemail requesting a call back.  (the primary contact number was listed as the home number, I called that phone number and the phone just rang and did not roll over to a voicemail. I then called the mobile number and that is the phone number in whic I left a voicemail.)

## 2015-12-20 ENCOUNTER — Telehealth: Payer: Self-pay | Admitting: *Deleted

## 2015-12-20 NOTE — Telephone Encounter (Signed)
Call to patient to discuss surgery dates. Surgery scheduled for 01-24-16 at 0730 at [facility] at 0730, arrive at 0600 unless hospital directs otherwise. Surgery instruction sheet reviewed and printed copy will be mailed, see copy scanned to chart.   Routing to provider for final review. Patient agreeable to disposition. Will close encounter.

## 2015-12-28 DIAGNOSIS — Z0289 Encounter for other administrative examinations: Secondary | ICD-10-CM

## 2016-01-04 ENCOUNTER — Telehealth: Payer: Self-pay | Admitting: Obstetrics and Gynecology

## 2016-01-04 ENCOUNTER — Ambulatory Visit (INDEPENDENT_AMBULATORY_CARE_PROVIDER_SITE_OTHER): Payer: 59 | Admitting: Obstetrics and Gynecology

## 2016-01-04 VITALS — BP 138/82 | HR 70 | Ht 66.0 in | Wt 160.2 lb

## 2016-01-04 DIAGNOSIS — N993 Prolapse of vaginal vault after hysterectomy: Secondary | ICD-10-CM | POA: Diagnosis not present

## 2016-01-04 NOTE — Telephone Encounter (Signed)
FMLA forms have been completed and faxed to [phone], attention: Tomi Bamberger, Benefit Specialist with patients employer, as instructed by patient. Should Sarah Fowler need to be reached, she can be reached at [phone]
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
## 2016-01-05 ENCOUNTER — Telehealth: Payer: Self-pay | Admitting: *Deleted

## 2016-01-05 ENCOUNTER — Encounter: Payer: Self-pay | Admitting: Obstetrics and Gynecology

## 2016-01-05 NOTE — Telephone Encounter (Signed)
Patient was in office yesterday for surgery consult with Dr Quincy Simmonds. Had question about surgery instruction sheet. Instruction sheet was reviewed and mailed 12-20-15. Call to patient, left message to call back.

## 2016-01-10 NOTE — Patient Instructions (Addendum)
Your procedure is scheduled on:  Tuesday, Oct. 24, 2017  Enter through the Main Entrance of [facility] at:  6:00 AM  Pick up the phone at the desk and dial [phone].  Call this number if you have problems the morning of surgery: [phone].  Remember: Do NOT eat food or drink after:  Midnight Monday, Oct. 23, 2017  Take these medicines the morning of surgery with a SIP OF WATER:  Sertraline (Zoloft)  Stop ALL herbal medications at this time   Do NOT wear jewelry (body piercing), metal hair clips/bobby pins, make-up, or nail polish. Do NOT wear lotions, powders, or perfumes.  You may wear deodorant. Do NOT shave for 48 hours prior to surgery. Do NOT bring valuables to the hospital. Contacts, dentures, or bridgework may not be worn into surgery.  Leave suitcase in car.  After surgery it may be brought to your room.  For patients admitted to the hospital, checkout time is 11:00 AM the day of discharge.

## 2016-01-11 ENCOUNTER — Encounter (HOSPITAL_COMMUNITY): Payer: Self-pay

## 2016-01-11 ENCOUNTER — Encounter (HOSPITAL_COMMUNITY)
Admission: RE | Admit: 2016-01-11 | Discharge: 2016-01-11 | Disposition: A | Discharge status: Home / Self Care | Payer: 59 | Source: Ambulatory Visit | Attending: Obstetrics and Gynecology | Admitting: Obstetrics and Gynecology

## 2016-01-11 ENCOUNTER — Telehealth: Payer: Self-pay | Admitting: Obstetrics and Gynecology

## 2016-01-11 DIAGNOSIS — Z01818 Encounter for other preprocedural examination: Secondary | ICD-10-CM | POA: Insufficient documentation

## 2016-01-11 HISTORY — DX: Chronic sinusitis, unspecified: J32.9

## 2016-01-11 HISTORY — DX: Other complications of anesthesia, initial encounter: T88.59XA

## 2016-01-11 HISTORY — DX: Adverse effect of unspecified anesthetic, initial encounter: T41.45XA

## 2016-01-11 LAB — BASIC METABOLIC PANEL
ANION GAP: 7 (ref 5–15)
BUN: 18 mg/dL (ref 6–20)
CALCIUM: 10.5 mg/dL — AB (ref 8.9–10.3)
CHLORIDE: 101 mmol/L (ref 101–111)
CO2: 31 mmol/L (ref 22–32)
Creatinine, Ser: 0.99 mg/dL (ref 0.44–1.00)
GFR calc Af Amer: >60 mL/min (ref >60)
GFR calc non Af Amer: >60 mL/min (ref >60)
GLUCOSE: 99 mg/dL (ref 65–99)
Potassium: 4.6 mmol/L (ref 3.5–5.1)
Sodium: 139 mmol/L (ref 135–145)

## 2016-01-11 LAB — CBC
HEMATOCRIT: 41.5 % (ref 36.0–46.0)
HEMOGLOBIN: 14 g/dL (ref 12.0–15.0)
MCH: 31.6 pg (ref 26.0–34.0)
MCHC: 33.7 g/dL (ref 30.0–36.0)
MCV: 93.7 fL (ref 78.0–100.0)
Platelets: 255 10*3/uL (ref 150–400)
RBC: 4.43 MIL/uL (ref 3.87–5.11)
RDW: 14.3 % (ref 11.5–15.5)
WBC: 8.2 10*3/uL (ref 4.0–10.5)

## 2016-01-11 NOTE — Telephone Encounter (Signed)
Phone call with Dr. Vladimir Crofts from patient's insurance company regarding my recommendation for an inpatient status post op.  I explained that the sacrocolpopexy is through a laparotomy incision and that the anterior and posterior colporrhaphy and TVT sling are vaginal procedures.  He indicated understanding that this is not a laparoscopic procedure.   He has given approval for the surgery as in patinet.  Confirmation code [id number].

## 2016-01-12 ENCOUNTER — Encounter: Payer: Self-pay | Admitting: Obstetrics and Gynecology

## 2016-01-12 NOTE — Telephone Encounter (Signed)
Approval has been noted in account notes. Ok to close

## 2016-01-16 NOTE — Telephone Encounter (Signed)
No response from patient. Duplicate copy of instruction sheet that was originally mailed on 12-20-15 was re-mailed on 01-06-16.   Routing to provider for final review. Will close encounter.
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
## 2016-01-23 MED ORDER — DEXTROSE 5 % IV SOLN
2.0000 g | INTRAVENOUS | Status: AC
  Administered 2016-01-24: 2 g via INTRAVENOUS
  Filled 2016-01-23: qty 2

## 2016-01-23 NOTE — Anesthesia Preprocedure Evaluation (Signed)
Anesthesia Evaluation  Patient identified by MRN, date of birth, ID band Patient awake    Reviewed: Allergy & Precautions, H&P , NPO status , Patient's Chart, lab work & pertinent test results  History of Anesthesia Complications Negative for: history of anesthetic complications  Airway Mallampati: II   Neck ROM: full    Dental   Pulmonary neg pulmonary ROS,    breath sounds clear to auscultation       Cardiovascular hypertension, Pt. on medications  Rhythm:regular Rate:Normal     Neuro/Psych  Headaches, Depression  Neuromuscular disease    GI/Hepatic negative GI ROS, Neg liver ROS,   Endo/Other  negative endocrine ROS  Renal/GU negative Renal ROS     Musculoskeletal  (+) Arthritis ,   Abdominal   Peds  Hematology negative hematology ROS (+)   Anesthesia Other Findings   Reproductive/Obstetrics negative OB ROS                             Anesthesia Physical  Anesthesia Plan  ASA: II  Anesthesia Plan: General   Post-op Pain Management:    Induction: Intravenous  Airway Management Planned: Oral ETT  Additional Equipment: None  Intra-op Plan:   Post-operative Plan: Extubation in OR  Informed Consent: I have reviewed the patients History and Physical, chart, labs and discussed the procedure including the risks, benefits and alternatives for the proposed anesthesia with the patient or authorized representative who has indicated his/her understanding and acceptance.   Dental Advisory Given  Plan Discussed with: CRNA  Anesthesia Plan Comments: (2x PIV)       Anesthesia Quick Evaluation

## 2016-01-24 ENCOUNTER — Inpatient Hospital Stay (HOSPITAL_COMMUNITY): Payer: 59 | Admitting: Anesthesiology

## 2016-01-24 ENCOUNTER — Encounter (HOSPITAL_COMMUNITY): Payer: Self-pay | Admitting: Emergency Medicine

## 2016-01-24 ENCOUNTER — Inpatient Hospital Stay (HOSPITAL_COMMUNITY)
Admission: RE | Admit: 2016-01-24 | Discharge: 2016-01-26 | DRG: 742 | Disposition: A | Discharge status: Home / Self Care | Payer: 59 | Source: Ambulatory Visit | Attending: Obstetrics and Gynecology | Admitting: Obstetrics and Gynecology

## 2016-01-24 ENCOUNTER — Encounter (HOSPITAL_COMMUNITY)
Admission: RE | Disposition: A | Discharge status: Home / Self Care | Payer: Self-pay | Source: Ambulatory Visit | Attending: Obstetrics and Gynecology

## 2016-01-24 DIAGNOSIS — G629 Polyneuropathy, unspecified: Secondary | ICD-10-CM | POA: Diagnosis present

## 2016-01-24 DIAGNOSIS — N816 Rectocele: Secondary | ICD-10-CM

## 2016-01-24 DIAGNOSIS — N993 Prolapse of vaginal vault after hysterectomy: Principal | ICD-10-CM | POA: Diagnosis present

## 2016-01-24 DIAGNOSIS — Z9889 Other specified postprocedural states: Secondary | ICD-10-CM

## 2016-01-24 DIAGNOSIS — N393 Stress incontinence (female) (male): Secondary | ICD-10-CM | POA: Diagnosis not present

## 2016-01-24 DIAGNOSIS — N83202 Unspecified ovarian cyst, left side: Secondary | ICD-10-CM | POA: Diagnosis present

## 2016-01-24 DIAGNOSIS — N736 Female pelvic peritoneal adhesions (postinfective): Secondary | ICD-10-CM | POA: Diagnosis not present

## 2016-01-24 DIAGNOSIS — J9811 Atelectasis: Secondary | ICD-10-CM | POA: Diagnosis not present

## 2016-01-24 DIAGNOSIS — I10 Essential (primary) hypertension: Secondary | ICD-10-CM | POA: Diagnosis present

## 2016-01-24 DIAGNOSIS — R339 Retention of urine, unspecified: Secondary | ICD-10-CM | POA: Diagnosis not present

## 2016-01-24 DIAGNOSIS — N83292 Other ovarian cyst, left side: Secondary | ICD-10-CM | POA: Diagnosis not present

## 2016-01-24 DIAGNOSIS — N8110 Cystocele, unspecified: Secondary | ICD-10-CM | POA: Diagnosis not present

## 2016-01-24 HISTORY — PX: CYSTOSCOPY: SHX5120

## 2016-01-24 HISTORY — PX: BLADDER SUSPENSION: SHX72

## 2016-01-24 HISTORY — PX: ANTERIOR AND POSTERIOR REPAIR: SHX5121

## 2016-01-24 HISTORY — PX: SALPINGOOPHORECTOMY: SHX82

## 2016-01-24 HISTORY — PX: ABDOMINAL SACROCOLPOPEXY: SHX1114

## 2016-01-24 SURGERY — SACROCOLPOPEXY, ABDOMINAL
Anesthesia: General | Site: Vagina

## 2016-01-24 MED ORDER — HYDROMORPHONE HCL 1 MG/ML IJ SOLN
INTRAMUSCULAR | Status: AC
  Filled 2016-01-24: qty 1

## 2016-01-24 MED ORDER — KETOROLAC TROMETHAMINE 30 MG/ML IJ SOLN
30.0000 mg | Freq: Four times a day (QID) | INTRAMUSCULAR | Status: AC
Start: 2016-01-24 — End: 2016-01-25
  Administered 2016-01-24 – 2016-01-25 (×3): 30 mg via INTRAVENOUS
  Filled 2016-01-24 (×3): qty 1

## 2016-01-24 MED ORDER — ONDANSETRON HCL 4 MG/2ML IJ SOLN
INTRAMUSCULAR | Status: AC
  Filled 2016-01-24: qty 2

## 2016-01-24 MED ORDER — GABAPENTIN 300 MG PO CAPS
600.0000 mg | ORAL_CAPSULE | Freq: Every day | ORAL | Status: DC
  Administered 2016-01-25 (×2): 600 mg via ORAL
  Filled 2016-01-24 (×3): qty 2

## 2016-01-24 MED ORDER — ONDANSETRON HCL 4 MG PO TABS
4.0000 mg | ORAL_TABLET | Freq: Four times a day (QID) | ORAL | Status: DC | PRN
Start: 2016-01-24 — End: 2016-01-26

## 2016-01-24 MED ORDER — SCOPOLAMINE 1 MG/3DAYS TD PT72
MEDICATED_PATCH | TRANSDERMAL | Status: AC
  Administered 2016-01-24: 1.5 mg via TRANSDERMAL
  Filled 2016-01-24: qty 1

## 2016-01-24 MED ORDER — SERTRALINE HCL 100 MG PO TABS
100.0000 mg | ORAL_TABLET | Freq: Every day | ORAL | Status: DC
  Administered 2016-01-25 – 2016-01-26 (×3): 100 mg via ORAL
  Filled 2016-01-24 (×4): qty 1

## 2016-01-24 MED ORDER — SCOPOLAMINE 1 MG/3DAYS TD PT72
1.0000 | MEDICATED_PATCH | Freq: Once | TRANSDERMAL | Status: DC
  Administered 2016-01-24: 1.5 mg via TRANSDERMAL

## 2016-01-24 MED ORDER — LACTATED RINGERS IV SOLN
INTRAVENOUS | Status: DC
  Administered 2016-01-24 (×4): via INTRAVENOUS

## 2016-01-24 MED ORDER — MIDAZOLAM HCL 2 MG/2ML IJ SOLN
INTRAMUSCULAR | Status: AC
  Filled 2016-01-24: qty 2

## 2016-01-24 MED ORDER — SUGAMMADEX SODIUM 200 MG/2ML IV SOLN
INTRAVENOUS | Status: DC | PRN
  Administered 2016-01-24: 150 mg via INTRAVENOUS

## 2016-01-24 MED ORDER — LIDOCAINE-EPINEPHRINE 1 %-1:100000 IJ SOLN
INTRAMUSCULAR | Status: AC
  Filled 2016-01-24: qty 1

## 2016-01-24 MED ORDER — SUMATRIPTAN SUCCINATE 50 MG PO TABS
50.0000 mg | ORAL_TABLET | Freq: Once | ORAL | Status: DC | PRN
Start: 2016-01-24 — End: 2016-01-26
  Filled 2016-01-24: qty 1

## 2016-01-24 MED ORDER — PROMETHAZINE HCL 25 MG/ML IJ SOLN
12.5000 mg | INTRAMUSCULAR | Status: DC | PRN
  Administered 2016-01-24: 12.5 mg via INTRAVENOUS
  Filled 2016-01-24: qty 1

## 2016-01-24 MED ORDER — HYDROMORPHONE HCL 1 MG/ML IJ SOLN
0.2500 mg | INTRAMUSCULAR | Status: DC | PRN
  Administered 2016-01-24 (×3): 0.5 mg via INTRAVENOUS

## 2016-01-24 MED ORDER — ROCURONIUM BROMIDE 100 MG/10ML IV SOLN
INTRAVENOUS | Status: AC
  Filled 2016-01-24: qty 1

## 2016-01-24 MED ORDER — LIDOCAINE HCL (CARDIAC) 20 MG/ML IV SOLN
INTRAVENOUS | Status: DC | PRN
  Administered 2016-01-24: 100 mg via INTRAVENOUS

## 2016-01-24 MED ORDER — SUGAMMADEX SODIUM 200 MG/2ML IV SOLN
INTRAVENOUS | Status: AC
  Filled 2016-01-24: qty 2

## 2016-01-24 MED ORDER — FENTANYL CITRATE (PF) 250 MCG/5ML IJ SOLN
INTRAMUSCULAR | Status: AC
  Filled 2016-01-24: qty 5

## 2016-01-24 MED ORDER — IBUPROFEN 600 MG PO TABS
600.0000 mg | ORAL_TABLET | Freq: Four times a day (QID) | ORAL | Status: DC | PRN
  Administered 2016-01-25 – 2016-01-26 (×3): 600 mg via ORAL
  Filled 2016-01-24 (×3): qty 1

## 2016-01-24 MED ORDER — ESTRADIOL 0.1 MG/GM VA CREA
TOPICAL_CREAM | VAGINAL | Status: AC
  Filled 2016-01-24: qty 42.5

## 2016-01-24 MED ORDER — PROMETHAZINE HCL 25 MG/ML IJ SOLN: 6.2500 mg | INTRAMUSCULAR | Status: DC | PRN

## 2016-01-24 MED ORDER — MONTELUKAST SODIUM 10 MG PO TABS
10.0000 mg | ORAL_TABLET | Freq: Every day | ORAL | Status: DC
  Administered 2016-01-25 (×2): 10 mg via ORAL
  Filled 2016-01-24 (×3): qty 1

## 2016-01-24 MED ORDER — VERAPAMIL HCL ER 120 MG PO TBCR
120.0000 mg | EXTENDED_RELEASE_TABLET | Freq: Every day | ORAL | Status: DC
  Administered 2016-01-25 (×2): 120 mg via ORAL
  Filled 2016-01-24 (×3): qty 1

## 2016-01-24 MED ORDER — ONDANSETRON HCL 4 MG/2ML IJ SOLN
INTRAMUSCULAR | Status: DC | PRN
  Administered 2016-01-24: 4 mg via INTRAVENOUS

## 2016-01-24 MED ORDER — EPHEDRINE SULFATE 50 MG/ML IJ SOLN
INTRAMUSCULAR | Status: DC | PRN
Start: 2016-01-24 — End: 2016-01-24
  Administered 2016-01-24: 10 mg via INTRAVENOUS
  Administered 2016-01-24: 5 mg via INTRAVENOUS
  Administered 2016-01-24 (×2): 10 mg via INTRAVENOUS

## 2016-01-24 MED ORDER — STERILE WATER FOR IRRIGATION IR SOLN
Status: DC | PRN
  Administered 2016-01-24: 1000 mL via INTRAVESICAL

## 2016-01-24 MED ORDER — ROCURONIUM BROMIDE 100 MG/10ML IV SOLN
INTRAVENOUS | Status: DC | PRN
  Administered 2016-01-24: 50 mg via INTRAVENOUS
  Administered 2016-01-24 (×3): 10 mg via INTRAVENOUS
  Administered 2016-01-24: 20 mg via INTRAVENOUS

## 2016-01-24 MED ORDER — HYDROMORPHONE 1 MG/ML IV SOLN
INTRAVENOUS | Status: DC
  Administered 2016-01-24: 0.4 mg via INTRAVENOUS
  Administered 2016-01-24: 15:00:00 via INTRAVENOUS
  Administered 2016-01-24: 0.12 mL via INTRAVENOUS
  Filled 2016-01-24: qty 25

## 2016-01-24 MED ORDER — LIDOCAINE-EPINEPHRINE 1 %-1:100000 IJ SOLN
INTRAMUSCULAR | Status: DC | PRN
  Administered 2016-01-24: 4 mL
  Administered 2016-01-24: 2 mL

## 2016-01-24 MED ORDER — DIPHENHYDRAMINE HCL 12.5 MG/5ML PO ELIX: 12.5000 mg | ORAL_SOLUTION | Freq: Four times a day (QID) | ORAL | Status: DC | PRN

## 2016-01-24 MED ORDER — MENTHOL 3 MG MT LOZG: 1.0000 | LOZENGE | OROMUCOSAL | Status: DC | PRN

## 2016-01-24 MED ORDER — EPHEDRINE 5 MG/ML INJ
INTRAVENOUS | Status: AC
  Filled 2016-01-24: qty 10

## 2016-01-24 MED ORDER — FENTANYL CITRATE (PF) 100 MCG/2ML IJ SOLN
INTRAMUSCULAR | Status: DC | PRN
  Administered 2016-01-24 (×2): 50 ug via INTRAVENOUS
  Administered 2016-01-24: 100 ug via INTRAVENOUS
  Administered 2016-01-24: 50 ug via INTRAVENOUS

## 2016-01-24 MED ORDER — LACTATED RINGERS IV BOLUS (SEPSIS)
500.0000 mL | Freq: Once | INTRAVENOUS | Status: AC
  Administered 2016-01-24: 500 mL via INTRAVENOUS

## 2016-01-24 MED ORDER — KETOROLAC TROMETHAMINE 30 MG/ML IJ SOLN
INTRAMUSCULAR | Status: AC
  Filled 2016-01-24: qty 1

## 2016-01-24 MED ORDER — ONDANSETRON HCL 4 MG/2ML IJ SOLN: 4.0000 mg | Freq: Four times a day (QID) | INTRAMUSCULAR | Status: DC | PRN

## 2016-01-24 MED ORDER — DIPHENHYDRAMINE HCL 50 MG/ML IJ SOLN: 12.5000 mg | Freq: Four times a day (QID) | INTRAMUSCULAR | Status: DC | PRN

## 2016-01-24 MED ORDER — DEXAMETHASONE SODIUM PHOSPHATE 10 MG/ML IJ SOLN
INTRAMUSCULAR | Status: DC | PRN
  Administered 2016-01-24: 10 mg via INTRAVENOUS

## 2016-01-24 MED ORDER — HYDROMORPHONE HCL 1 MG/ML IJ SOLN
INTRAMUSCULAR | Status: DC | PRN
  Administered 2016-01-24 (×2): 0.5 mg via INTRAVENOUS

## 2016-01-24 MED ORDER — PROPOFOL 10 MG/ML IV BOLUS
INTRAVENOUS | Status: AC
  Filled 2016-01-24: qty 20

## 2016-01-24 MED ORDER — MEPERIDINE HCL 25 MG/ML IJ SOLN: 6.2500 mg | INTRAMUSCULAR | Status: DC | PRN

## 2016-01-24 MED ORDER — LACTATED RINGERS IV SOLN
INTRAVENOUS | Status: DC
  Administered 2016-01-25: via INTRAVENOUS

## 2016-01-24 MED ORDER — PROMETHAZINE HCL 25 MG/ML IJ SOLN: 25.0000 mg | INTRAMUSCULAR | Status: DC | PRN

## 2016-01-24 MED ORDER — MIDAZOLAM HCL 5 MG/5ML IJ SOLN
INTRAMUSCULAR | Status: DC | PRN
  Administered 2016-01-24: 2 mg via INTRAVENOUS

## 2016-01-24 MED ORDER — DEXAMETHASONE SODIUM PHOSPHATE 10 MG/ML IJ SOLN
INTRAMUSCULAR | Status: AC
  Filled 2016-01-24: qty 1

## 2016-01-24 MED ORDER — ESTRADIOL 0.1 MG/GM VA CREA
TOPICAL_CREAM | VAGINAL | Status: DC | PRN
Start: 2016-01-24 — End: 2016-01-24
  Administered 2016-01-24: 1 via VAGINAL

## 2016-01-24 MED ORDER — PROPOFOL 10 MG/ML IV BOLUS
INTRAVENOUS | Status: DC | PRN
  Administered 2016-01-24: 150 mg via INTRAVENOUS

## 2016-01-24 MED ORDER — KETOROLAC TROMETHAMINE 30 MG/ML IJ SOLN
INTRAMUSCULAR | Status: DC | PRN
  Administered 2016-01-24: 30 mg via INTRAVENOUS

## 2016-01-24 MED ORDER — SODIUM CHLORIDE 0.9% FLUSH: 9.0000 mL | INTRAVENOUS | Status: DC | PRN

## 2016-01-24 MED ORDER — LIDOCAINE HCL (CARDIAC) 20 MG/ML IV SOLN
INTRAVENOUS | Status: AC
  Filled 2016-01-24: qty 5

## 2016-01-24 MED ORDER — NALOXONE HCL 0.4 MG/ML IJ SOLN: 0.4000 mg | INTRAMUSCULAR | Status: DC | PRN

## 2016-01-24 MED ORDER — LACTATED RINGERS IV SOLN: INTRAVENOUS | Status: DC

## 2016-01-24 SURGICAL SUPPLY — 69 items
BLADE SURG 11 STRL SS (BLADE) ×6 IMPLANT
BLADE SURG 15 STRL LF C SS BP (BLADE) ×4 IMPLANT
BLADE SURG 15 STRL SS (BLADE) ×2
CANISTER SUCT 3000ML (MISCELLANEOUS) ×6 IMPLANT
CATH FOLEY 2WAY SLVR  5CC 18FR (CATHETERS) ×2
CATH FOLEY 2WAY SLVR 5CC 18FR (CATHETERS) ×4 IMPLANT
CLOTH BEACON ORANGE TIMEOUT ST (SAFETY) ×6 IMPLANT
CONT PATH 16OZ SNAP LID 3702 (MISCELLANEOUS) ×6 IMPLANT
DECANTER SPIKE VIAL GLASS SM (MISCELLANEOUS) ×6 IMPLANT
DEVICE CAPIO SLIM SINGLE (INSTRUMENTS) IMPLANT
DISSECTOR SPONGE CHERRY (GAUZE/BANDAGES/DRESSINGS) ×6 IMPLANT
DRAPE UNDERBUTTOCKS STRL (DRAPE) ×6 IMPLANT
DRAPE WARM FLUID 44X44 (DRAPE) ×6 IMPLANT
DRSG OPSITE POSTOP 4X10 (GAUZE/BANDAGES/DRESSINGS) ×6 IMPLANT
DURAPREP 26ML APPLICATOR (WOUND CARE) ×6 IMPLANT
ELECT BLADE 6 FLAT ULTRCLN (ELECTRODE) ×6 IMPLANT
FILTER STRAW FLUID ASPIR (MISCELLANEOUS) IMPLANT
GAUZE PACKING 1 X5 YD ST (GAUZE/BANDAGES/DRESSINGS) ×6 IMPLANT
GAUZE PACKING 2X5 YD STRL (GAUZE/BANDAGES/DRESSINGS) IMPLANT
GAUZE SPONGE 4X4 16PLY XRAY LF (GAUZE/BANDAGES/DRESSINGS) ×6 IMPLANT
GLOVE BIO SURGEON STRL SZ 6.5 (GLOVE) ×10 IMPLANT
GLOVE BIO SURGEONS STRL SZ 6.5 (GLOVE) ×2
GLOVE BIOGEL PI IND STRL 6.5 (GLOVE) ×4 IMPLANT
GLOVE BIOGEL PI IND STRL 7.0 (GLOVE) ×16 IMPLANT
GLOVE BIOGEL PI INDICATOR 6.5 (GLOVE) ×2
GLOVE BIOGEL PI INDICATOR 7.0 (GLOVE) ×8
GOWN STRL REUS W/TWL LRG LVL3 (GOWN DISPOSABLE) ×30 IMPLANT
LEGGING LITHOTOMY PAIR STRL (DRAPES) ×6 IMPLANT
LIQUID BAND (GAUZE/BANDAGES/DRESSINGS) ×12 IMPLANT
MESH RESTORELLE Y CONTOUR (Mesh General) ×6 IMPLANT
NEEDLE HYPO 22GX1.5 SAFETY (NEEDLE) ×6 IMPLANT
NEEDLE MAYO 6 CRC TAPER PT (NEEDLE) IMPLANT
NS IRRIG 1000ML POUR BTL (IV SOLUTION) ×12 IMPLANT
PACK ABDOMINAL GYN (CUSTOM PROCEDURE TRAY) ×6 IMPLANT
PACK TRENDGUARD 450 HYBRID PRO (MISCELLANEOUS) ×4 IMPLANT
PACK VAGINAL MINOR WOMEN LF (CUSTOM PROCEDURE TRAY) IMPLANT
PACK VAGINAL WOMENS (CUSTOM PROCEDURE TRAY) IMPLANT
PAD MAGNETIC INST (MISCELLANEOUS) ×6 IMPLANT
PLUG CATH AND CAP STER (CATHETERS) ×6 IMPLANT
RTRCTR C-SECT PINK 25CM LRG (MISCELLANEOUS) ×6 IMPLANT
SET CYSTO W/LG BORE CLAMP LF (SET/KITS/TRAYS/PACK) ×6 IMPLANT
SHEET LAVH (DRAPES) ×6 IMPLANT
SLING TVT EXACT (Sling) ×6 IMPLANT
SPONGE LAP 18X18 X RAY DECT (DISPOSABLE) ×6 IMPLANT
SPONGE SURGIFOAM ABS GEL 12-7 (HEMOSTASIS) IMPLANT
SURGIFLO TRUKIT (HEMOSTASIS) IMPLANT
SURGIFLO W/THROMBIN 8M KIT (HEMOSTASIS) ×6 IMPLANT
SUT CAPIO ETHIBPND (SUTURE) IMPLANT
SUT ETHIBOND 0 (SUTURE) ×54 IMPLANT
SUT PLAIN 2 0 XLH (SUTURE) IMPLANT
SUT VIC AB 0 CT1 18XCR BRD8 (SUTURE) ×4 IMPLANT
SUT VIC AB 0 CT1 27 (SUTURE) ×10
SUT VIC AB 0 CT1 27XBRD ANBCTR (SUTURE) ×20 IMPLANT
SUT VIC AB 0 CT1 8-18 (SUTURE) ×2
SUT VIC AB 2-0 CT1 (SUTURE) ×6 IMPLANT
SUT VIC AB 2-0 CT1 27 (SUTURE)
SUT VIC AB 2-0 CT1 TAPERPNT 27 (SUTURE) IMPLANT
SUT VIC AB 2-0 CT2 27 (SUTURE) IMPLANT
SUT VIC AB 2-0 SH 27 (SUTURE) ×12
SUT VIC AB 2-0 SH 27XBRD (SUTURE) ×24 IMPLANT
SUT VIC AB 2-0 UR6 27 (SUTURE) ×6 IMPLANT
SUT VIC AB 4-0 PS2 27 (SUTURE) ×6 IMPLANT
TOWEL OR 17X24 6PK STRL BLUE (TOWEL DISPOSABLE) ×12 IMPLANT
TRAY FOLEY CATH SILVER 14FR (SET/KITS/TRAYS/PACK) IMPLANT
TRAY FOLEY CATH SILVER 16FR (SET/KITS/TRAYS/PACK) ×6 IMPLANT
TRENDGUARD 450 HYBRID PRO PACK (MISCELLANEOUS) ×6
TUBING NON-CON 1/4 X 20 CONN (TUBING) ×5 IMPLANT
TUBING NON-CON 1/4 X 20' CONN (TUBING) ×1
WATER STERILE IRR 1000ML POUR (IV SOLUTION) IMPLANT

## 2016-01-24 NOTE — Op Note (Signed)
OPERATIVE REPORT  PREOPERATIVE DIAGNOSIS: Post hysterectomy vaginal vault prolapse.  POSTOPERATIVE DIAGNOSIS:  Post hysterectomy vaginal vault prolapse, genuine stress incontinence, left ovarian cyst, pelvic adhesions  PROCEDURE: Abdominal sacrocolpopexy, bilateral salpingo-oophorectomy, lysis of pelvic adhesions, Halban's culdoplasty, TVT Exact midurethral sling and cystoscopy, posterior colporrhaphy.  SURGEON: Lenard Galloway, MD  ASSISTANT:  Dorothy Spark, MD  ANESTHESIA: General endotracheal.  IV FLUIDS: 2500 cc  Ringer's lactate.  EBL: 100 cc  URINE OUTPUT: [id number]  COMPLICATIONS: None.  INDICATIONS FOR THE PROCEDURE: The patient is a 55 year old Caucasian female, who presents with post hysterectomy vaginal prolapse and urinary incontinence with coughing, laughing, and sneezing. The patient was unable to maintain a pessary, and she now requests surgical care. On physical exam, the patient has a third degree cystocele, almost second degree vaginal vault prolapse, and a first degree rectocele. She did undergo multichannel urodynamic testing in the office with reduction of the prolapse and she did not demonstrate any stress incontinence.  The plan is now made to proceed with an abdominal sacrocolpopexy, potential Halban's culdoplasty, TVT Exact mid urethral sling, and possible anterior and posterior colporrhaphy under my direction. Risks, benefits, and alternatives have been reviewed with the patient who wishes to proceed.  FINDINGS: Examination under anesthesia revealed a third-degree cystocele and second vaginal apical prolapse and a first-degree rectocele.    At the time of laparotomy, the left ovary contained a 2 cm cyst,  The right ovary appeared atrophic and luteinized.  The fallopian tubes looked consistent with tubal ligation.  The uterus was surgically absent. There were minor adhesions of omentum to the vaginal cuff.   Cystoscopy at termination of  the bladder portion of the procedure documented the bladder to be intact throughout 360 degrees including the bladder dome and trigone. There was no evidence of a foreign body in the bladder or the urethra. The ureters were patent bilaterally.  SPECIMENS: Bilateral tubes and ovaries to pathology.  PROCEDURE IN DETAIL: The patient was reidentified in the preop hold area. She did receive cefotetan 2 g IV for antibiotic prophylaxis. She received both TED hose and PAS stockings for DVT prophylaxis.  The patient was escorted to the operating room, where she was placed in the dorsal lithotomy position in Peck. General endotracheal anesthesia was induced. The patient's lower abdomen, vagina, and perineum were sterilely prepped and she was sterilely draped. The Foley catheter was placed inside the bladder and left to gravity drainage throughout and at the termination of the procedure.  The procedure began by creating a Pfannenstiel incision with a scalpel.  The incision was carried down to the subcutaneous layer using monopolar cautery for hemostasis.  The fascia was incised in the midlien with a scalpel, and the incision was extended bilaterally with a Mayo scissors.  The rectus muscles were bluntly divided in the midlines.  The parietal peritoneum was elevated with 2 hemostats and was entered bluntly.  The peritoneal incision was extended cranially and caudally with the Metzenbaum scissors.   An Alexis retractor was placed inside the peritoneal cavity, and the bowel was packed into the upper abdomen with moistened lap pads after the patient was placed in Trendelenburg position.      Adhesions in the cul de sac were removed with sharp excision with a Metzenbaum scissors.  The salpingo-oophorectomy was performed bilaterally after identification of the ureters.  A window was created through the peritoneum of the right pelvic side wall near the infundibulopelvic ligament.  A Haney clamp  was  placed across this ligament and across the proximal right round ligament.  The right tube and ovary were excised and sent to pathology.  The pedicles were free tied with 0/0 Vicryl and then a free tie of the same. The same procedure was performed on the patient's left side.  The left tube and ovary were sent to pathology. Hemostatss was good.  The patient was noted to slide up the table toward anesthesia as the surgery progressed, and she was repositioned on the operating room table.  The Trenguard was placed by the anesthesia and nursing team. She then held a good position on the operating room table.  The abdominal sacrocolpopexy was performed next. An EEA Sizer was placed in the vagina at this time and the peritoneum along the anterior and posterior vaginal walls was opened and dissection performed down to the level of the underside of the vagina, both anteriorly and posteriorly.     Attention at this time was turned to the sacral promontory. The peritoneum overlying the sacral promontory was identified along with the landmarks of the bifurcation of the aorta and iliac vessels and the right ureter. The peritoneum was opened using a combination of a Metzenbaum scissors and monopolar cautery. Careful dissection was performed down to the level of the sacral promontory. The anterior longitudinal ligament was identified along with the middle sacral vessels. Hemostasis was created with monopolar cautery during this dissection. The peritoneum was then opened inferiorly along the patient's right pelvic sidewall and all the way down to the level of dissection along the posterior vaginal peritoneal incision.  At this time, the Colpoplast Restorelle mesh was trimmed to properly fit the patient's anatomy and the mesh was secured to the anterior vagina using a series of several 0 Ethibond sutures. The same was performed along the posterior vaginal wall.  The EEA Sizer was removed and a hand was placed  inside the vagina and the elevation of the vaginal cuff was simulated. The point of attachment of the Restorelle mesh was then mapped to the sacral promontory and 2 sutures of 0 Ethibond were brought through the anterior longitudinal ligament to the right of the middle sacral vessels. These sutures were then brought through the mesh. The sutures were tied. Excess mesh was trimmed at this time. The mesh was re- peritonealized by closing the retroperitoneal space using 2-0 Vicryl suture. This allowed 100% coverage of the mesh material.  A Halban's culdoplasty was performed in standard fashion with three 0/0 Ethibond sutures.  The ureters were respected with placement of these sutures.  Hemostasis was good and the abdomen was closed. The Alexis retractor was removed and the lap pads were taken out.  The parietal peritoneum was closed with a running suture of 2/0 Vicryl.  The pyramidalis muscle was made hemostatic with monopolar cautery.  The fascia was closed with a running suture of 0/0 Vicryl.  The subcutaneous layer was irrigated and made hemostatic with monopolar cautery.  Interrupted sutures of 2/0 plain were placed.  The skin was closed with a subcuticular suture of 4/0 Vicryl.  Dermabond was placed over the incision.   Attention was turned to the vaginal portion of the procedure. The patient was examined and there was excellent vaginal cuff support. There was no evidence of a cystocele at this time. There was a first-degree rectocele noted.  There was evidence of three small 1 mm areas of Ethibond suture noted in the vaginal mucosa from the sacrocolpopexy procedure. They were bleeding slightly  and were hemostatic at the end of the surgery.   Allis clamps were used to mark a 4 cm area underneath the beginning 1 cm below the urethral meatus and extending down toward the vaginal cuff.  The mucosa was injected locally with 1% lidocaine with epinephrine 1:100,000. The mucosa was incised  vertically in the midline with a scalpel and a combination of sharp and blunt dissection were used to dissect this vaginal tissue and mucosa off the underlying bladder. The dissection was carried back to the pubic rami.  A 1 cm suprapubic incisions were then created 2 cm to the right and left in the midline. The Foley catheter was removed. The TVT Exact was performed in a bottom up fashion. The urethral guide and Foley tip were placed in the urethra and the urethra was deflected properly as the TVT guide was brought through the right retropubic space and up through the right suprapubic incision. The urethra was then deflected in the opposite direction and the TVT guide was placed through the left retropubic space and through the left suprapubic ipsilateral incision.  The obturator guide was removed at this time and cystoscopy was performed and the findings were as noted above.  The bladder was drained of all cystoscopic fluid and the Foley catheter was replaced. The sling was brought up through the suprapubic incisions bilaterally. The plastic sheaths were removed as a Kelly clamp was placed between the urethra and the sling. The sling was noted to be in excellent position. Excess mesh was trimmed suprapubically. Surgiflow was placed over the dissection site for the midurethral sling.  Hemostasis was good. The vaginal skin edges were trimmed at this time and the anterior vaginal wall was closed with a running lock suture of 2-0 Vicryl.  The suprapubic incisions were closed at the termination of the procedure with Dermabond.  Attention was turned to the posterior vaginal wall at this time. Allis clamps were used to mark the introitus, the perineal body, and then the posterior vaginal wall mucosa. The perineal body and the posterior vaginal mucosa were injected with 1% lidocaine with epinephrine 1:100,000. A triangular wedge of epithelium was excised from the perineal body and the posterior  vaginal mucosa was incised vertically in the midline with the Metzenbaum scissors. Sharp and blunt dissection were used to dissect the perirectal fascia off the vaginal mucosa bilaterally. The posterior colporrhaphy was then performed with vertical mattress sutures of 0 Vicryl which reduced the rectocele. Crown stitches of 0 Vicryl were placed along the perineal body. Rectal exam confirmed the absence of sutures in the rectum. Excess vaginal mucosa was trimmed and the posterior vaginal mucosa was closed with a running lock suture of 2-0 Vicryl. This was brought down to the hymenal ring and then under the hymen. The transverse perineal muscles were reapproximated with a running suture of this 2-0 Vicryl. The suture was then brought up the perineal body in a subcuticular fashion as for an episiotomy repair. The knot was tied at the hymen.  Vaginal examination confirmed excellent vaginal support and elevation. Final rectal exam documented the absence of sutures in the rectum. A gauze packing with Estrace cream was placed inside the vagina.  At this point, the patient was awakened and extubated, and escorted to the recovery room in stable condition. There were no complications. All needle, instrument, and sponge counts were correct.   Lenard Galloway, M.D.

## 2016-01-24 NOTE — Anesthesia Procedure Notes (Signed)
Procedure Name: Intubation Date/Time: 01/24/2016 7:32 AM Performed by: Riki Sheer Pre-anesthesia Checklist: Patient identified, Emergency Drugs available, Suction available, Patient being monitored and Timeout performed Patient Re-evaluated:Patient Re-evaluated prior to inductionOxygen Delivery Method: Circle system utilized Preoxygenation: Pre-oxygenation with 100% oxygen Intubation Type: IV induction Ventilation: Mask ventilation without difficulty Laryngoscope Size: Miller and 2 Tube size: 7.0 mm Number of attempts: 1 Airway Equipment and Method: Stylet Placement Confirmation: ETT inserted through vocal cords under direct vision,  CO2 detector,  breath sounds checked- equal and bilateral and positive ETCO2 Secured at: 22 cm Tube secured with: Tape Dental Injury: Teeth and Oropharynx as per pre-operative assessment

## 2016-01-24 NOTE — Anesthesia Postprocedure Evaluation (Signed)
Anesthesia Post Note  Patient: Kanaria Schatzel  Procedure(s) Performed: Procedure(s) (LRB): ABDOMINO SACROCOLPOPEXY with Halbans culdoplasty (N/A) POSTERIOR REPAIR (RECTOCELE) (N/A) TRANSVAGINAL TAPE (TVT) PROCEDURE exact midurethral sling (N/A) CYSTOSCOPY (N/A) SALPINGO OOPHORECTOMY (Bilateral)  Patient location during evaluation: PACU Anesthesia Type: General Level of consciousness: awake and sedated Pain management: pain level controlled Vital Signs Assessment: post-procedure vital signs reviewed and stable Respiratory status: spontaneous breathing Cardiovascular status: stable Postop Assessment: no signs of nausea or vomiting Anesthetic complications: no     Last Vitals:  Vitals:   01/24/16 1330 01/24/16 1345  BP: 102/67 (!) 105/53  Pulse: (!) 103 95  Resp: 12 (!) 8  Temp:      Last Pain:  Vitals:   01/24/16 1315  TempSrc:   PainSc: 5    Pain Goal: Patients Stated Pain Goal: 3 (01/24/16 1300)               Adelae Yodice JR,JOHN Jacalynn Buzzell

## 2016-01-24 NOTE — Progress Notes (Signed)
Update to History and Physical  No marked change in status since office pre-op visit.   Patient examined.   OK to proceed with surgery. 

## 2016-01-24 NOTE — Transfer of Care (Signed)
Immediate Anesthesia Transfer of Care Note  Patient: Sarah Fowler  Procedure(s) Performed: Procedure(s): ABDOMINO SACROCOLPOPEXY with Halbans culdoplasty (N/A) POSTERIOR REPAIR (RECTOCELE) (N/A) TRANSVAGINAL TAPE (TVT) PROCEDURE exact midurethral sling (N/A) CYSTOSCOPY (N/A) SALPINGO OOPHORECTOMY (Bilateral)  Patient Location: PACU  Anesthesia Type:General  Level of Consciousness: awake, alert  and oriented  Airway & Oxygen Therapy: Patient Spontanous Breathing and Patient connected to nasal cannula oxygen  Post-op Assessment: Report given to RN and Post -op Vital signs reviewed and stable  Post vital signs: Reviewed and stable  Last Vitals:  Vitals:   01/24/16 0605  BP: 133/69  Pulse: 74  Resp: 16  Temp: 36.6 C    Last Pain:  Vitals:   01/24/16 0605  TempSrc: Oral      Patients Stated Pain Goal: 3 ([id number])  Complications: No apparent anesthesia complications

## 2016-01-24 NOTE — Anesthesia Postprocedure Evaluation (Signed)
Anesthesia Post Note  Patient: Sarah Fowler  Procedure(s) Performed: Procedure(s) (LRB): ABDOMINO SACROCOLPOPEXY with Halbans culdoplasty (N/A) POSTERIOR REPAIR (RECTOCELE) (N/A) TRANSVAGINAL TAPE (TVT) PROCEDURE exact midurethral sling (N/A) CYSTOSCOPY (N/A) SALPINGO OOPHORECTOMY (Bilateral)  Patient location during evaluation: [facility] Anesthesia Type: General Level of consciousness: awake and alert Pain management: pain level controlled Vital Signs Assessment: post-procedure vital signs reviewed and stable Respiratory status: spontaneous breathing, nonlabored ventilation and patient connected to nasal cannula oxygen Cardiovascular status: stable Postop Assessment: no signs of nausea or vomiting and adequate PO intake Anesthetic complications: no     Last Vitals:  Vitals:   01/24/16 1518 01/24/16 1608  BP:  (!) 110/57  Pulse:  84  Resp: 10 12  Temp:  36.7 C    Last Pain:  Vitals:   01/24/16 1608  TempSrc: Oral  PainSc:    Pain Goal: Patients Stated Pain Goal: 3 (01/24/16 1300)               Yanelly Cantrelle Hristova

## 2016-01-24 NOTE — Progress Notes (Signed)
Day of Surgery Procedure(s) (LRB): ABDOMINO SACROCOLPOPEXY with Halbans culdoplasty (N/A) POSTERIOR REPAIR (RECTOCELE) (N/A) TRANSVAGINAL TAPE (TVT) PROCEDURE exact midurethral sling (N/A) CYSTOSCOPY (N/A) SALPINGO OOPHORECTOMY (Bilateral)  Subjective: Patient reports tolerating PO.   Eating crackers and drinking soda.  Not out of bed yet.  Using incentive spirometry.  Notes numbness in left hand, stocking glove distribution.  No hx of carpal tunnel syndrome.   Objective: I have reviewed patient's vital signs and intake and output. Vitals:   01/24/16 1518 01/24/16 1608  BP:  (!) 110/57  Pulse:  84  Resp: 10 12  Temp:  98 F (36.7 C)   Pulse has been in the low 100s. I/O - 3200 cc IV/450 cc UO   General: alert and cooperative Resp: clear to auscultation bilaterally Cardio: regular rate and rhythm, S1, S2 normal, no murmur, click, rub or gallop GI: soft, non-tender; bowel sounds normal; no masses,  no organomegaly and incision: clean, dry and intact Extremities: Ted hose and PAS on.  DPs 2+ bilaterally.  Vaginal Bleeding: minimal Upper extremities:  Strength of hands:  Right 5/5, left 4.5/5.  Decreased sensation of right phalanges. Sensation of arm normal.  Assessment: s/p Procedure(s): ABDOMINO SACROCOLPOPEXY with Halbans culdoplasty (N/A) POSTERIOR REPAIR (RECTOCELE) (N/A) TRANSVAGINAL TAPE (TVT) PROCEDURE exact midurethral sling (N/A) CYSTOSCOPY (N/A) SALPINGO OOPHORECTOMY (Bilateral): stable  Neuropathy of left hand.  Sensory.  Plan: Dilaudid PCA and Toradol.  Vaginal packing overnight.  Continue foley.  Observation of neuropathy.  May need wrist guard and physical therapy if this does not improve.  Discussed surgical findings and procedure including the removal of bilateral tubes and ovaries due to the unexpected finding of a left ovarian cyst in menopause.  Patient indicates understanding.  CMP and CBC in am.    LOS: 0 days    Arloa Koh 01/24/2016,  5:23 PM

## 2016-01-25 ENCOUNTER — Encounter (HOSPITAL_COMMUNITY): Payer: Self-pay | Admitting: Obstetrics and Gynecology

## 2016-01-25 LAB — COMPREHENSIVE METABOLIC PANEL
ALK PHOS: 66 U/L (ref 38–126)
ALT: 14 U/L (ref 14–54)
AST: 19 U/L (ref 15–41)
Albumin: 3 g/dL — ABNORMAL LOW (ref 3.5–5.0)
Anion gap: 5 (ref 5–15)
BILIRUBIN TOTAL: 0.3 mg/dL (ref 0.3–1.2)
BUN: 16 mg/dL (ref 6–20)
CALCIUM: 8.6 mg/dL — AB (ref 8.9–10.3)
CO2: 28 mmol/L (ref 22–32)
CREATININE: 0.8 mg/dL (ref 0.44–1.00)
Chloride: 106 mmol/L (ref 101–111)
GFR calc Af Amer: >60 mL/min (ref >60)
Glucose, Bld: 98 mg/dL (ref 65–99)
POTASSIUM: 4.4 mmol/L (ref 3.5–5.1)
Sodium: 139 mmol/L (ref 135–145)
TOTAL PROTEIN: 5.4 g/dL — AB (ref 6.5–8.1)

## 2016-01-25 LAB — CBC
HEMATOCRIT: 31.9 % — AB (ref 36.0–46.0)
HEMOGLOBIN: 10.8 g/dL — AB (ref 12.0–15.0)
MCH: 32.1 pg (ref 26.0–34.0)
MCHC: 33.9 g/dL (ref 30.0–36.0)
MCV: 94.9 fL (ref 78.0–100.0)
Platelets: 221 10*3/uL (ref 150–400)
RBC: 3.36 MIL/uL — ABNORMAL LOW (ref 3.87–5.11)
RDW: 14.9 % (ref 11.5–15.5)
WBC: 11.7 10*3/uL — AB (ref 4.0–10.5)

## 2016-01-25 MED ORDER — HYDROMORPHONE HCL 2 MG PO TABS
2.0000 mg | ORAL_TABLET | ORAL | Status: DC | PRN
  Administered 2016-01-25 – 2016-01-26 (×4): 2 mg via ORAL
  Filled 2016-01-25 (×4): qty 1

## 2016-01-25 NOTE — Progress Notes (Signed)
1 Day Post-Op Procedure(s) (LRB): ABDOMINO SACROCOLPOPEXY with Halbans culdoplasty (N/A) POSTERIOR REPAIR (RECTOCELE) (N/A) TRANSVAGINAL TAPE (TVT) PROCEDURE exact midurethral sling (N/A) CYSTOSCOPY (N/A) SALPINGO OOPHORECTOMY (Bilateral)  Subjective: Patient reports migraine headache and nausea last hs.  Resolved with Immitrex.  Ambulated once.  Tolerated crackers and soda.  Objective: I have reviewed patient's vital signs, intake and output and labs. Vitals:   01/25/16 0552 01/25/16 0555  BP:  (!) 109/56  Pulse:  84  Resp: 14 14  Temp:  99 F (37.2 C)   I/O - 3370 cc/2300 cc UO. WBC 11.7 Hgb 10.8  General: alert and cooperative Resp: clear to auscultation bilaterally Cardio: regular rate and rhythm, S1, S2 normal, no murmur, click, rub or gallop GI: soft, non-tender; bowel sounds normal; no masses,  no organomegaly, incision: clean, dry, intact and ecchymoses around Pfannenstiel incision, no induration. and Dermabond on. Extremities: PAS adn Ted hose on. Vaginal Bleeding: minimal  Assessment: s/p Procedure(s): ABDOMINO SACROCOLPOPEXY with Halbans culdoplasty (N/A) POSTERIOR REPAIR (RECTOCELE) (N/A) TRANSVAGINAL TAPE (TVT) PROCEDURE exact midurethral sling (N/A) CYSTOSCOPY (N/A) SALPINGO OOPHORECTOMY (Bilateral): stable  Plan: Advance diet Encourage ambulation Advance to PO medication Discontinue IV fluids    LOS: 1 day    [employer] 01/25/2016, 7:42 AM

## 2016-01-25 NOTE — Anesthesia Postprocedure Evaluation (Signed)
Anesthesia Post Note  Patient: Sarah Fowler  Procedure(s) Performed: Procedure(s) (LRB): ABDOMINO SACROCOLPOPEXY with Halbans culdoplasty (N/A) POSTERIOR REPAIR (RECTOCELE) (N/A) TRANSVAGINAL TAPE (TVT) PROCEDURE exact midurethral sling (N/A) CYSTOSCOPY (N/A) SALPINGO OOPHORECTOMY (Bilateral)  Patient location during evaluation: PACU Anesthesia Type: General Level of consciousness: sedated and patient cooperative Pain management: pain level controlled Vital Signs Assessment: post-procedure vital signs reviewed and stable Respiratory status: spontaneous breathing Cardiovascular status: stable Anesthetic complications: no    Last Vitals:  Vitals:   01/25/16 1349 01/25/16 1733  BP: (!) 132/57 120/66  Pulse: 96 85  Resp: 18 16  Temp:  36.8 C    Last Pain:  Vitals:   01/25/16 1733  TempSrc: Oral  PainSc:                  Nolon Nations

## 2016-01-25 NOTE — Progress Notes (Signed)
GYN POST OP DAY ONE ADDENDUM  Left had is less numb today.  Feels numbness in first three phalanges, fingertips only.   Neuropathy resolving.

## 2016-01-26 MED ORDER — CIPROFLOXACIN HCL 250 MG PO TABS: 250.0000 mg | ORAL_TABLET | Freq: Two times a day (BID) | ORAL | 0 refills | Status: DC

## 2016-01-26 MED ORDER — HYDROMORPHONE HCL 2 MG PO TABS: 2.0000 mg | ORAL_TABLET | ORAL | 0 refills | Status: DC | PRN

## 2016-01-26 MED ORDER — IBUPROFEN 600 MG PO TABS: 600.0000 mg | ORAL_TABLET | Freq: Four times a day (QID) | ORAL | 0 refills | Status: DC | PRN

## 2016-01-26 MED ORDER — CIPROFLOXACIN HCL 250 MG PO TABS
250.0000 mg | ORAL_TABLET | Freq: Two times a day (BID) | ORAL | Status: DC
  Filled 2016-01-26 (×2): qty 1

## 2016-01-26 NOTE — Progress Notes (Signed)
Foley Cath D/C'd as ordered, all parts accounted for. Pt tolerated procedure well. Pt education done.

## 2016-01-26 NOTE — Progress Notes (Signed)
Pt discharged to home with husband.  Condition stable.  Pt home with Foley catheter. Catheter instructions reviewed with both pt and husband including changing between large drainage bag and leg bag, emptying both bags, leg straps for both bags, keeping bag below bladder and showering.  Pt performed return demonstration for emptying large drainage bag and attaching tubing to leg strap. Pt to car via wheelchair with B. Jimmye Norman, Therapist, sports.  No equipment other than above ordered for home at discharge.

## 2016-01-26 NOTE — Discharge Instructions (Signed)
Sacrocolpopexy, Care After Refer to this sheet in the next few weeks. These instructions provide you with information on caring for yourself after your procedure. Your health care provider may also give you more specific instructions. Your treatment has been planned according to current medical practices, but problems sometimes occur. Call your health care provider if you have any problems or questions after your procedure.  WHAT TO EXPECT AFTER THE PROCEDURE  After your procedure, it is typical to have the following:  Pain.  Some vaginal bleeding.  Fatigue. HOME CARE INSTRUCTIONS  Medicine  Only take medicines as directed by your health care provider.  Make sure to finish antibiotic medicine even if you start to feel better. Bandages  Care for your bandages and cuts (incisions) as directed by your surgeon.  Do not shower until after your bandages have been removed. Activity  Take at least two 10-minute walks each day.  Ask your health care provider when you can return to work and do all your usual activities. It may take 3 months to recover completely.  You will have to restrict many activities when you first return home. For at least 6 weeks:  Do not lift anything heavier than a gallon of milk.  Do not sit on a bike seat.  Do not use a tampon or put anything inside your vagina.  Do not have sexual intercourse.  Do not participate instrenuous activities like running or aerobics. Other Instructions  Do not take a bath or go swimming until your health care provider says you can. Most people can shower after about 6 weeks after the procedure.  Drink enough fluids to keep your urine clear or pale yellow. This helps prevent constipation.  See your health care provider to have your stitches or staples removed if necessary.  Keep all follow-up appointments. SEEK MEDICAL CARE IF:   You have chills or fever.  Your pain medicine is not helping.  You have vaginal bleeding  or discharge that will not go away. SEEK IMMEDIATE MEDICAL CARE IF:   You have a fever for more than 2-3 days.  You have very bad pain.  You have heavy vaginal bleeding or discharge.  You have any signs of infection around your cut (incision). Watch for:  Redness.  Tenderness.  Warmth.  Pus or other discharge.  You develop a warm, tender area in your leg.  You have chest pain or trouble breathing.   This information is not intended to replace advice given to you by your health care provider. Make sure you discuss any questions you have with your health care provider.   Document Released: 03/24/2013 Document Reviewed: 03/24/2013 Elsevier Interactive Patient Education 2016 [employer]  Urethral Vaginal Sling, Care After Refer to this sheet in the next few weeks. These instructions provide you with information on caring for yourself after your procedure. Your health care provider may also give you more specific instructions. Your treatment has been planned according to current medical practices, but problems sometimes occur. Call your health care provider if you have any problems or questions after your procedure.  WHAT TO EXPECT AFTER THE PROCEDURE  After your procedure, it is typical to have the following:  A catheter in your bladder until your bladder is able to work on its own properly. You will be instructed on how to empty the catheter bag.  Absorbable stitches in your incisions. They will slowly dissolve over 1-2 months. HOME CARE INSTRUCTIONS  Get plenty of rest.  Only take  over-the-counter or prescription medicines as directed by your health care provider. Do not take aspirin because it can cause bleeding.  Do not take baths. Take showers until your health care provider tells you otherwise.  You may resume your usual diet. Eat a well-balanced diet.  Drink enough fluids to keep your urine clear or pale yellow.  Limit exercise and activities as directed by your  health care provider. Do not lift anything heavier than 5 pounds (2.3 kg).  Do not douche, use tampons, or have sexual intercourse for 6 weeks after your procedure.  Follow up with your health care provider as directed. SEEK MEDICAL CARE IF:  You have a heavy or bad smelling vaginal discharge.   You have a rash.   You have pain that is not controlled with medicines.   You have lightheadedness or feel faint.  SEEK IMMEDIATE MEDICAL CARE IF:  You have a fever.   You have vaginal bleeding.   You faint.   You have shortness of breath.   You have chest, abdominal, or leg pain.   You have pain when urinating or cannot urinate.   Your catheter is still in your bladder and becomes blocked.   You have swelling, redness, and pain in the vaginal area.    This information is not intended to replace advice given to you by your health care provider. Make sure you discuss any questions you have with your health care provider.   Document Released: 01/07/2013 Document Reviewed: 01/07/2013 Elsevier Interactive Patient Education 2016 [employer].  Anterior and Posterior Colporrhaphy, Care After Refer to this sheet in the next few weeks. These instructions provide you with information on caring for yourself after your procedure. Your health care provider may also give you more specific instructions. Your treatment has been planned according to current medical practices, but problems sometimes occur. Call your health care provider if you have any problems or questions after your procedure. HOME CARE INSTRUCTIONS   Take frequent rest periods throughout the day.   Only take over-the-counter or prescription medicines as directed by your health care provider.   Avoid strenuous activity such as heavy lifting (more than 10 pounds [4.5 kg]), pushing, and pulling until your health care provider says it is okay.   Take showers if your health care provider approves. Pat incisions  dry. Do not rub incisions with a washcloth or towel. Do not take tub baths until your health care provider approves.   Wear compression stockings as directed by your health care provider. These stockings help prevent blood clots from forming in your legs.   Talk with your health care provider about when you may return to work and your exercise routine.   Do not drive until your health care provider approves.   You may resume your normal diet. Eat a well-balanced diet.   Drink enough fluids to keep your urine clear or pale yellow.   Your normal bowel function should return. If you become constipated, you may:   Take a mild laxative.   Add fruit and bran to your diet.   Drink more fluids.  Do not have sexual intercourse until permitted by your health care provider.  Follow up with your health care provider as directed. SEEK MEDICAL CARE IF: You have persistent nausea or vomiting.  SEEK IMMEDIATE MEDICAL CARE IF:   You have increased bleeding (more than a small spot) from the vaginal area.   Your pain is not relieved with medicine or becomes worse.  You have redness, swelling, or increasing pain in the vaginal area.   You have abdominal pain.   You see pus coming from the wounds.   You develop a fever.   You have a foul smell coming from your vaginal area.   You develop light-headedness or you feel faint.   You have difficulty breathing.  MAKE SURE YOU:  Understand these instructions.  Will watch your condition.  Will get help right away if you are not doing well or get worse.   This information is not intended to replace advice given to you by your health care provider. Make sure you discuss any questions you have with your health care provider.   Document Released: 10/05/2004 Document Revised: 11/19/2012 Document Reviewed: 08/08/2012 Elsevier Interactive Patient Education [employer].

## 2016-01-26 NOTE — Progress Notes (Signed)
2 Days Post-Op Procedure(s) (LRB): ABDOMINO SACROCOLPOPEXY with Halbans culdoplasty (N/A) POSTERIOR REPAIR (RECTOCELE) (N/A) TRANSVAGINAL TAPE (TVT) PROCEDURE exact midurethral sling (N/A) CYSTOSCOPY (N/A) SALPINGO OOPHORECTOMY (Bilateral)  Subjective: Patient reports tolerating PO and + flatus.   Ambulating.  Foley out.  No void yet.  Good pain control.  Final pathology report showing serous cysts of ovary, benign tubes and ovaries.   Objective: I have reviewed patient's vital signs and intake and output. Vitals:   01/25/16 2145 01/26/16 0533  BP: (!) 102/58 (!) 108/58  Pulse: 71 69  Resp: 16 16  Temp: 99.1 F (37.3 C) 99.3 F (37.4 C)   I/O - 2796 cc/1700 cc.  General: alert and cooperative Resp: clear to auscultation bilaterally Cardio: regular rate and rhythm, S1, S2 normal, no murmur, click, rub or gallop GI: soft, non-tender; bowel sounds normal; no masses,  no organomegaly and incision: clean, dry and intact Vaginal Bleeding: minimal  Assessment: s/p Procedure(s): ABDOMINO SACROCOLPOPEXY with Halbans culdoplasty (N/A) POSTERIOR REPAIR (RECTOCELE) (N/A) TRANSVAGINAL TAPE (TVT) PROCEDURE exact midurethral sling (N/A) CYSTOSCOPY (N/A) SALPINGO OOPHORECTOMY (Bilateral): progressing well and ready for discharge today.    Plan: Continue voiding trials to determine if will be D/C'd with or without catheter.  Rx for Dilaudid and Motrin.  Discussed post op instructions and given in written and oral form.  Follow up in 4 days.    LOS: 2 days    Arloa Koh 01/26/2016, 7:52 AM

## 2016-01-30 ENCOUNTER — Ambulatory Visit (INDEPENDENT_AMBULATORY_CARE_PROVIDER_SITE_OTHER): Payer: 59 | Admitting: Obstetrics and Gynecology

## 2016-01-30 ENCOUNTER — Other Ambulatory Visit: Payer: Self-pay | Admitting: Obstetrics and Gynecology

## 2016-01-30 DIAGNOSIS — Z9889 Other specified postprocedural states: Secondary | ICD-10-CM

## 2016-01-30 MED ORDER — HYDROMORPHONE HCL 2 MG PO TABS: 2.0000 mg | ORAL_TABLET | ORAL | 0 refills | Status: DC | PRN

## 2016-01-30 NOTE — Patient Instructions (Signed)
Call the office is you are not able to void!

## 2016-01-30 NOTE — Telephone Encounter (Signed)
Medication refill request: Ibuprofen 600mg   Last AEX:  05-18-15  Next AEX: 05-23-16 Last MMG (if hormonal medication request): 06-29-15 WNL  Refill authorized: please advise
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
## 2016-01-31 ENCOUNTER — Encounter: Payer: Self-pay | Admitting: Obstetrics and Gynecology

## 2016-02-01 NOTE — Discharge Summary (Signed)
Physician Discharge Summary  Patient ID: Sarah Fowler MRN: [id number] DOB/AGE: November 22, 1960 55 y.o.  Admit date: 01/24/2016 Discharge date:  01/26/16  Admission Diagnoses: 1.  Post hysterectomy vaginal vault prolapse. 2.  Cystocele. 3.  Rectocele. 4.  Genuine stress incontinence.  Discharge Diagnoses:  1.  Post hysterectomy vaginal vault prolapse. 2.  Cystocele. 3.  Rectocele. 4.  Genuine stress incontinence. 5.  Status post Abdominal sacrocolpopexy, bilateral salpingo-oophorectomy, lysis of pelvic adhesions, Halban's culdoplasty, TVT Exact midurethral sling and cystoscopy, posterior colporrhaphy on 01/24/16. 6.  Post op urinary retention. 7.  Left hand sensory neuropathy.  Active Problems:   Status post laparotomy   Discharged Condition: good  Hospital Course:   The patient was admitted on 01/24/16 for an abdominal sacrocolpopexy, bilateral salpingo-oophorectomy, lysis of pelvic adhesions, Halban's culdoplasty, TVT Exact midurethral sling and cystoscopy, posterior colporrhaphy, which were performed without complication while under general anesthesia.  She had an intraoperative finding of a left ovarian cyst which prompted the removal of both tubes and ovaries.  Final pathology showed benign serous cysts of the ovary, and the fallopian tubes were benign. The night of her surgery, she had a migraine headache treated with Imitrex, which resolved the headache. She had a dilaudid PCA and Toradol for post op pain control initially, and this was converted over to oral Dilaudid and Motrin on post op day one when the patient began taking po well.  She ambulated independently and wore PAS and Ted hose for DVT prophylaxis while in bed.  Her vaginal packing were removed on post op day one, and she had no significant vaginal bleeding. The patient's vital signs remained stable and she demonstrated no signs of infection during her hospitalization.  Her post op day one WBC of 11.7 was attributed to the  extensive dissection of her surgery.  She had low grade temps in the 99 range which were attributed to atelectasis. The patient's post op day one Hgb was 10.8. She was tolerating the this well.  She had very minimal vaginal bleeding, and her incision(s) demonstrated no signs of erythema or significant drainage.  She demonstrated signs of a post op sensory neuropathy of her left hand, and this improved during the hospitalization. She had no motor deficits.  Her foley catheter was removed on post op day two, and her voided spontaneously, but her residual volumes were above 100 cc.  Her foley catheter was replaced and she was discharge to home with the foley to gravity drainage. She was found to be in good condition and ready for discharge on post op day two.  Consults: None  Significant Diagnostic Studies: labs:  See hospital course.  Treatments: surgery:  abdominal sacrocolpopexy, bilateral salpingo-oophorectomy, lysis of pelvic adhesions, Halban's culdoplasty, TVT Exact midurethral sling and cystoscopy, posterior colporrhaphy performed on 01/24/16.  Discharge Exam: Blood pressure 131/69, pulse 76, temperature 98.4 F (36.9 C), temperature source Oral, resp. rate 16, height 5\' 6"  (1.676 m), weight 158 lb (71.7 kg), last menstrual period 08/31/2004, SpO2 94 %. General: alert and cooperative Resp: clear to auscultation bilaterally Cardio: regular rate and rhythm, S1, S2 normal, no murmur, click, rub or gallop GI: soft, non-tender; bowel sounds normal; no masses,  no organomegaly and incision: clean, dry and intact Vaginal Bleeding: minimal  Disposition: 01-Home or Self Care  Instructions were reviewed in verbal and written form.  She received a written prescription for Dilaudid oral tabs.    Medication List    TAKE these medications   alendronate 70 MG tablet  Commonly known as:  FOSAMAX Take 70 mg by mouth once a week. Wednesday   CALCIUM 600-D PO Take 2 tablets by mouth daily.    ciprofloxacin 250 MG tablet Commonly known as:  CIPRO Take 1 tablet (250 mg total) by mouth 2 (two) times daily.   gabapentin 300 MG capsule Commonly known as:  NEURONTIN Take 600 mg by mouth at bedtime.   montelukast 10 MG tablet Commonly known as:  SINGULAIR Take 10 mg by mouth at bedtime.   MULTIVITAMIN PO Take 1 tablet by mouth daily.   promethazine 25 MG tablet Commonly known as:  PHENERGAN Take 25 mg by mouth as needed for nausea or vomiting.   sertraline 100 MG tablet Commonly known as:  ZOLOFT Take 100 mg by mouth daily.   verapamil 120 MG CR tablet Commonly known as:  CALAN-SR Take 120 mg by mouth at bedtime.   zolmitriptan 5 MG tablet Commonly known as:  ZOMIG Take 5 mg by mouth as needed for migraine (q2gr not to exceed 10mg  daily).      Follow-up [place], MD Follow up in 4 day(s).   Specialty:  Obstetrics and Gynecology Contact information: [address] [place] [address] [phone]           Signed: Arloa Koh 02/01/2016, 10:26 AM

## 2016-02-03 ENCOUNTER — Other Ambulatory Visit: Payer: Self-pay | Admitting: Obstetrics and Gynecology

## 2016-02-03 ENCOUNTER — Telehealth: Payer: Self-pay | Admitting: Obstetrics and Gynecology

## 2016-02-03 ENCOUNTER — Encounter: Payer: Self-pay | Admitting: Obstetrics and Gynecology

## 2016-02-03 ENCOUNTER — Ambulatory Visit (INDEPENDENT_AMBULATORY_CARE_PROVIDER_SITE_OTHER): Payer: 59 | Admitting: Obstetrics and Gynecology

## 2016-02-03 VITALS — BP 142/90 | HR 100 | Temp 98.4°F | Resp 16 | Wt 157.4 lb

## 2016-02-03 DIAGNOSIS — R339 Retention of urine, unspecified: Secondary | ICD-10-CM

## 2016-02-03 LAB — POCT URINALYSIS DIPSTICK
Bilirubin, UA: NEGATIVE
Blood, UA: NEGATIVE
Glucose, UA: NEGATIVE
Ketones, UA: NEGATIVE
Leukocytes, UA: NEGATIVE
Nitrite, UA: NEGATIVE
Protein, UA: NEGATIVE
Urobilinogen, UA: NEGATIVE
pH, UA: 7

## 2016-02-03 NOTE — Telephone Encounter (Signed)
Medication refill request: ibuprofen 600mg  Last AEX:  05-18-15 Next AEX: 05-23-16 Last MMG (if hormonal medication request): 06-29-15 category b density birads 2:neg Refill authorized: pt is here in the office. Patient had ABDOMINO SACROCOLPOPEXY with Halbans culdoplasty    POSTERIOR REPAIR (RECTOCELE)    TRANSVAGINAL TAPE (TVT) PROCEDURE exact midurethral sling    CYSTOSCOPY    SALPINGO OOPHORECTOMY      surgery done on 01-24-16. Please approve or deny rx.
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
## 2016-02-03 NOTE — Progress Notes (Signed)
Patient's name and DOB verified. Verbal consent obtained for insertion of foley catheter. No allergies to latex or betadine. Using sterile technique, 14 french catheter inserted. Urine flow immediately noted. 700 cc's total were drained and patient voiced relief. 10 cc's sterile water inserted to hold catheter in place. Leg strap used to secure catheter due to patient having an allergy to tape. Patient tolerated procedure well. Florinda Marker, CMA present for entire procedure.

## 2016-02-03 NOTE — Telephone Encounter (Signed)
Patient was in on Monday and had her Cath pulled.  Patient says she has a strong urge to void but is unable to.  Patient states her stomach is starting to hurt.  Spoke with Gay Filler. Per Gay Filler patient advised to come to office immediately to be seen.

## 2016-02-04 ENCOUNTER — Encounter: Payer: Self-pay | Admitting: Obstetrics and Gynecology

## 2016-02-06 ENCOUNTER — Encounter: Payer: Self-pay | Admitting: Obstetrics and Gynecology

## 2016-02-06 ENCOUNTER — Ambulatory Visit (INDEPENDENT_AMBULATORY_CARE_PROVIDER_SITE_OTHER): Payer: 59 | Admitting: Obstetrics and Gynecology

## 2016-02-06 VITALS — BP 140/70 | HR 80 | Wt 157.0 lb

## 2016-02-06 DIAGNOSIS — R339 Retention of urine, unspecified: Secondary | ICD-10-CM
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
## 2016-02-10 ENCOUNTER — Telehealth: Payer: Self-pay | Admitting: Obstetrics and Gynecology

## 2016-02-10 NOTE — Telephone Encounter (Signed)
No additional recommendations.  I will see her for her 6 week post op visit.

## 2016-02-10 NOTE — Telephone Encounter (Signed)
Spoke with patient. Patient calling with update from [facility] 02/06/16. Patient states she has found that eating heavier meals she has a more difficult time urinating. Patient reports trying smaller meals and is able to urinate easier. Patient reports only self cathing x1 on 11/9- unmeasured. Patient denies pain, blood in urine, or odor. Patient reports urine is normal color. Advised patient RN would update Dr. Quincy Simmonds and return call if any additional recommendations. Patient is agreeable.   Dr. Quincy Simmonds, any additional recommendations?

## 2016-02-10 NOTE — Telephone Encounter (Signed)
Patient says she was told to follow up with nurse on how she is doing.

## 2016-02-10 NOTE — Telephone Encounter (Signed)
Left message to call Kierstan Auer at [phone].  

## 2016-02-12 ENCOUNTER — Encounter (HOSPITAL_COMMUNITY): Payer: Self-pay

## 2016-02-12 ENCOUNTER — Inpatient Hospital Stay (HOSPITAL_COMMUNITY)
Admission: AD | Admit: 2016-02-12 | Discharge: 2016-02-12 | Disposition: A | Discharge status: Home / Self Care | Payer: 59 | Source: Ambulatory Visit | Attending: Gynecology | Admitting: Gynecology

## 2016-02-12 DIAGNOSIS — L7622 Postprocedural hemorrhage and hematoma of skin and subcutaneous tissue following other procedure: Secondary | ICD-10-CM | POA: Diagnosis not present

## 2016-02-12 DIAGNOSIS — N939 Abnormal uterine and vaginal bleeding, unspecified: Secondary | ICD-10-CM | POA: Diagnosis present

## 2016-02-12 LAB — URINE MICROSCOPIC-ADD ON

## 2016-02-12 LAB — URINALYSIS, ROUTINE W REFLEX MICROSCOPIC
GLUCOSE, UA: NEGATIVE mg/dL
KETONES UR: NEGATIVE mg/dL
Nitrite: NEGATIVE
PH: 7 (ref 5.0–8.0)
PROTEIN: 100 mg/dL — AB
Specific Gravity, Urine: 1.01 (ref 1.005–1.030)

## 2016-02-12 LAB — CBC
HEMATOCRIT: 38.4 % (ref 36.0–46.0)
Hemoglobin: 13 g/dL (ref 12.0–15.0)
MCH: 31.6 pg (ref 26.0–34.0)
MCHC: 33.9 g/dL (ref 30.0–36.0)
MCV: 93.2 fL (ref 78.0–100.0)
Platelets: 339 10*3/uL (ref 150–400)
RBC: 4.12 MIL/uL (ref 3.87–5.11)
RDW: 13.9 % (ref 11.5–15.5)
WBC: 8.6 10*3/uL (ref 4.0–10.5)

## 2016-02-12 NOTE — MAU Note (Signed)
Pt presents to MAU with complaints of vaginal bleeding that started today. PT had sling put in under her bladder due to prolapse on the 24th of October with removal of tubes. Dr Quincy Simmonds told her to come in if she had any vaginal bleeding.

## 2016-02-12 NOTE — ED Notes (Signed)
  Patient is a 55 year [facility] she had contacted me on the phone as a result of some vaginal bleeding that she experienced earlier today. She was instructed to come in to be evaluated. Patient stated she felt some discomfort during the melanotic and every time she went to the bathroom and wiped she noted some blood on her tissue. She wasn't sure if it was from her bladder from her vagina.  Her records were reviewed and on October 24 Dr. Sharen Counter had performed abdominal sacral colpopexy with Halban's culdoplasty, posterior repair (rectocele) transvaginal tape (TVT) procedure mid urethral sling along with bilateral salpingo-oophorectomy. Patient postop had some urinary retention had been in the office several times but recently has been voiding ambulating tolerating regular diet and passing gas with no problems. She had called the office on Friday she has some spotting but was instructed to monitor unless it became heavy and this is the reason for the visit to the ED.  Physical exam:  Blood pressure   154/94, 138/86    respiration 18 pulse 83 and 99 respectively temperature 98.2  Gen. appearance well-developed well-nourished female with chief complaint Lungs: Clear to auscultation Rogers or wheezes Heart: Regular rate and rhythm no murmurs or gallops Back: No CVA tenderness Abdomen soft nontender no rebound or guarding incision ports intact There was some dried blood noted in her medial thighs Speculum exam demonstrated evidence of the suburethral sling area //colporrhaphy a small area that was oozing which was contained with silver nitrate and Monsel solution as well as a small raw area near the posterior colporrhaphy site. Vaginal cuff otherwise intact no other abnormality noted.  CBC: White blood count 8.6, hemoglobin 13 hematocrit 38.4 platelet count 4 and 39,000   Urine few bacteria red blood cells seen moderate leukocytes culture  pending  Assessment/plan: 55 year old who is status post abdominal sacral colpopexy with Halban's culdoplasty, posterior repair (rectocele) transvaginal tape (TVT) procedure mid urethral sling along with bilateral salpingo-oophorectomy 19 days ago with some mild bleeding attributed to some raw suture area that was contained with silver nitrate and Monsel solution. She was instructed to rest at home no lifting or straining is activity if any rebleeding occurs she should contact us otherwise her postop appointment with Dr. Quincy Simmonds is in the first week of December.

## 2016-02-13 ENCOUNTER — Other Ambulatory Visit: Payer: Self-pay | Admitting: Obstetrics and Gynecology

## 2016-02-13 NOTE — Telephone Encounter (Signed)
Medication refill request: Ibuprofen 600mg   Last AEX:  05-18-15 Next AEX: 05-23-16  Last MMG (if hormonal medication request): 06-29-15 WNL Refill authorized: please advise   Sending to JJ since BS is out of office

## 2016-02-18 ENCOUNTER — Other Ambulatory Visit: Payer: Self-pay | Admitting: Obstetrics and Gynecology

## 2016-02-20 NOTE — Telephone Encounter (Signed)
Medication refill request: Ibuprofen 600mg   Last AEX:  05-18-15  Next AEX: 05-23-16  Last MMG (if hormonal medication request): 06-29-15 WNL  Refill authorized: please advise   Sending to JJ since BS is out of office

## 2016-02-25 ENCOUNTER — Other Ambulatory Visit: Payer: Self-pay | Admitting: Obstetrics and Gynecology

## 2016-03-05 ENCOUNTER — Encounter: Payer: Self-pay | Admitting: Obstetrics and Gynecology

## 2016-03-05 ENCOUNTER — Ambulatory Visit (INDEPENDENT_AMBULATORY_CARE_PROVIDER_SITE_OTHER): Payer: 59 | Admitting: Obstetrics and Gynecology

## 2016-03-05 VITALS — BP 126/74 | HR 84 | Resp 16 | Ht 66.0 in | Wt 161.0 lb

## 2016-03-05 DIAGNOSIS — Z9889 Other specified postprocedural states: Secondary | ICD-10-CM

## 2016-03-05 MED ORDER — ESTROGENS, CONJUGATED 0.625 MG/GM VA CREA: TOPICAL_CREAM | VAGINAL | 2 refills | Status: DC
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
## 2016-04-18 ENCOUNTER — Ambulatory Visit: Payer: 59 | Admitting: Obstetrics and Gynecology

## 2016-04-19 ENCOUNTER — Ambulatory Visit: Payer: 59 | Admitting: Obstetrics and Gynecology

## 2016-04-25 ENCOUNTER — Ambulatory Visit (INDEPENDENT_AMBULATORY_CARE_PROVIDER_SITE_OTHER): Payer: 59 | Admitting: Obstetrics and Gynecology

## 2016-04-25 ENCOUNTER — Encounter: Payer: Self-pay | Admitting: Obstetrics and Gynecology

## 2016-04-25 VITALS — BP 114/62 | HR 64 | Ht 66.0 in | Wt 158.0 lb

## 2016-04-25 DIAGNOSIS — Z9889 Other specified postprocedural states: Secondary | ICD-10-CM
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
## 2016-05-04 ENCOUNTER — Encounter: Payer: Self-pay | Admitting: Certified Nurse Midwife

## 2016-05-20 ENCOUNTER — Encounter: Payer: Self-pay | Admitting: Obstetrics and Gynecology

## 2016-05-21 ENCOUNTER — Encounter: Payer: Self-pay | Admitting: Obstetrics and Gynecology

## 2016-05-21 ENCOUNTER — Ambulatory Visit (INDEPENDENT_AMBULATORY_CARE_PROVIDER_SITE_OTHER): Payer: 59 | Admitting: Obstetrics and Gynecology

## 2016-05-21 ENCOUNTER — Telehealth: Payer: Self-pay | Admitting: Obstetrics and Gynecology

## 2016-05-21 VITALS — BP 118/80 | HR 68 | Resp 16 | Wt 156.0 lb

## 2016-05-21 DIAGNOSIS — R339 Retention of urine, unspecified: Secondary | ICD-10-CM

## 2016-05-21 DIAGNOSIS — N766 Ulceration of vulva: Secondary | ICD-10-CM

## 2016-05-21 LAB — POCT URINALYSIS DIPSTICK
Bilirubin, UA: NEGATIVE
Glucose, UA: NEGATIVE
KETONES UA: NEGATIVE
LEUKOCYTES UA: NEGATIVE
NITRITE UA: NEGATIVE
PH UA: 6
PROTEIN UA: NEGATIVE
RBC UA: NEGATIVE
Urobilinogen, UA: NEGATIVE

## 2016-05-21 MED ORDER — IBUPROFEN 800 MG PO TABS: 800.0000 mg | ORAL_TABLET | Freq: Three times a day (TID) | ORAL | 1 refills | Status: DC | PRN

## 2016-05-21 MED ORDER — VALACYCLOVIR HCL 1 G PO TABS
1000.0000 mg | ORAL_TABLET | Freq: Two times a day (BID) | ORAL | 0 refills | Status: DC
Start: 2016-05-21 — End: 2017-05-18

## 2016-05-21 NOTE — Telephone Encounter (Signed)
Spoke with patient. Patient states she was advised by Dr. Quincy Simmonds through [place] conversation to f/u with OV today for evaluation. Patient states she had surgery 01/24/16 and is experiencing pain at "lowest" incision site. Patient states pain is shooting pains that go downwards and can feel a knot at incision site. Patient denies any drainage or redness. Patient states she took 800 mg motrin and placed heat to the area 2/18 as advised per Dr. Quincy Simmonds. Patient states this worked for pain relief and made her sleepy. Patient scheduled for 05/21/16 at 10:15am with Dr. Quincy Simmonds. Patient is agreeable to date and time.  Routing to provider for final review. Patient is agreeable to disposition. Will close encounter.

## 2016-05-21 NOTE — Telephone Encounter (Signed)
Patient calling to schedule an appointment with Dr Quincy Simmonds after sending her a message thru [place].
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
## 2016-05-23 ENCOUNTER — Ambulatory Visit: Payer: 59 | Admitting: Certified Nurse Midwife

## 2016-05-23 LAB — HERPES SIMPLEX VIRUS CULTURE: ORGANISM ID, BACTERIA: NOT DETECTED

## 2016-05-24 ENCOUNTER — Telehealth: Payer: Self-pay

## 2016-05-24 ENCOUNTER — Other Ambulatory Visit: Payer: Self-pay | Admitting: Obstetrics and Gynecology

## 2016-05-24 DIAGNOSIS — N9089 Other specified noninflammatory disorders of vulva and perineum: Secondary | ICD-10-CM

## 2016-05-24 NOTE — Telephone Encounter (Signed)
Spoke with patient and notified HSV culture was negative. Patient states she is feeling better with Valtrex and Ibuprofen. Reminded her Dr.Silva may want her to have blood testing for HSV infection. Patient states cannot return to office for labs until Monday or Tuesday. Do you still want her to come in for the lab work? Routing to Dr.Silva

## 2016-05-24 NOTE — Telephone Encounter (Signed)
-----   Message from Nunzio Cobbs, MD sent at 05/23/2016  7:40 PM EST ----- Please report HSV negative culture to patient and see how her pain is doing.  If she is not feeling any better, I would like to have her return to the office to see me.  We did discuss potential blood testing for HSV infection also.

## 2016-05-24 NOTE — Telephone Encounter (Signed)
I do recommend the blood work for HSV I and II.  I will place a future order and the patient can schedule the lab appointment at her convenience.

## 2016-05-25 NOTE — Telephone Encounter (Signed)
Spoke with patient and advised Dr.Silva recommends blood work for HSV I and II. She states she has appointment 05-30-16 for AEX with D.Leonard and will have labs drawn at that time. Advised patient order is already placed in computer.

## 2016-05-30 ENCOUNTER — Ambulatory Visit (INDEPENDENT_AMBULATORY_CARE_PROVIDER_SITE_OTHER): Payer: 59 | Admitting: Certified Nurse Midwife

## 2016-05-30 ENCOUNTER — Encounter: Payer: Self-pay | Admitting: Certified Nurse Midwife

## 2016-05-30 VITALS — BP 120/72 | HR 70 | Resp 16 | Ht 64.75 in | Wt 158.0 lb

## 2016-05-30 DIAGNOSIS — Z01419 Encounter for gynecological examination (general) (routine) without abnormal findings: Secondary | ICD-10-CM

## 2016-05-30 DIAGNOSIS — Z0000 Encounter for general adult medical examination without abnormal findings: Secondary | ICD-10-CM

## 2016-05-30 LAB — CBC WITH DIFFERENTIAL/PLATELET
BASOS ABS: 75 {cells}/uL (ref 0–200)
Basophils Relative: 1 %
EOS ABS: 300 {cells}/uL (ref 15–500)
Eosinophils Relative: 4 %
HCT: 41.2 % (ref 35.0–45.0)
HEMOGLOBIN: 13.4 g/dL (ref 11.7–15.5)
LYMPHS ABS: 2550 {cells}/uL (ref 850–3900)
Lymphocytes Relative: 34 %
MCH: 30.9 pg (ref 27.0–33.0)
MCHC: 32.5 g/dL (ref 32.0–36.0)
MCV: 94.9 fL (ref 80.0–100.0)
MONO ABS: 600 {cells}/uL (ref 200–950)
MPV: 9.1 fL (ref 7.5–12.5)
Monocytes Relative: 8 %
Neutro Abs: 3975 cells/uL (ref 1500–7800)
Neutrophils Relative %: 53 %
Platelets: 278 10*3/uL (ref 140–400)
RBC: 4.34 MIL/uL (ref 3.80–5.10)
RDW: 14.3 % (ref 11.0–15.0)
WBC: 7.5 10*3/uL (ref 3.8–10.8)

## 2016-05-30 LAB — POCT URINALYSIS DIPSTICK
Bilirubin, UA: NEGATIVE
Glucose, UA: NEGATIVE
Ketones, UA: NEGATIVE
Leukocytes, UA: NEGATIVE
NITRITE UA: NEGATIVE
PROTEIN UA: NEGATIVE
RBC UA: NEGATIVE
UROBILINOGEN UA: NEGATIVE
pH, UA: 5

## 2016-05-30 LAB — COMPREHENSIVE METABOLIC PANEL
ALT: 14 U/L (ref 6–29)
AST: 23 U/L (ref 10–35)
Albumin: 4.1 g/dL (ref 3.6–5.1)
Alkaline Phosphatase: 98 U/L (ref 33–130)
BUN: 17 mg/dL (ref 7–25)
CHLORIDE: 103 mmol/L (ref 98–110)
CO2: 28 mmol/L (ref 20–31)
CREATININE: 0.95 mg/dL (ref 0.50–1.05)
Calcium: 9.9 mg/dL (ref 8.6–10.4)
GLUCOSE: 73 mg/dL (ref 65–99)
Potassium: 4.7 mmol/L (ref 3.5–5.3)
SODIUM: 141 mmol/L (ref 135–146)
Total Bilirubin: 0.2 mg/dL (ref 0.2–1.2)
Total Protein: 6.8 g/dL (ref 6.1–8.1)

## 2016-05-30 LAB — TSH: TSH: 1.03 mIU/L

## 2016-05-30 NOTE — Patient Instructions (Signed)
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
## 2016-05-30 NOTE — Progress Notes (Signed)
Encounter reviewed Jill Jertson, MD   

## 2016-05-31 LAB — VITAMIN D 25 HYDROXY (VIT D DEFICIENCY, FRACTURES): Vit D, 25-Hydroxy: 56 ng/mL (ref 30–100)

## 2016-06-05 ENCOUNTER — Other Ambulatory Visit: Payer: Self-pay | Admitting: Obstetrics and Gynecology

## 2016-06-05 NOTE — Telephone Encounter (Signed)
Medication refill request: Ibuprofen Last AEX:  05/30/16 DL Next AEX: 06/05/17 DL Last MMG (if hormonal medication request):  Refill authorized: 05/21/16 #30 1R. Please advise. Thank you.

## 2016-06-14 ENCOUNTER — Other Ambulatory Visit: Payer: Self-pay | Admitting: Certified Nurse Midwife

## 2016-06-14 NOTE — Telephone Encounter (Signed)
Medication refill request: Ibuprofen  Last AEX:  05-30-16  Next AEX: 06-05-17  Last MMG (if hormonal medication request): 06-28-16 WNL  Refill authorized: please advise

## 2016-06-14 NOTE — Telephone Encounter (Signed)
This patient needs a phone call if she is consuming this much medication we need to know why. She is a post op of Dr. Quincy Simmonds, but my patient.

## 2016-06-14 NOTE — Telephone Encounter (Signed)
Spoke with patient and she does not need this refilled at this time -eh

## 2016-07-10 ENCOUNTER — Encounter: Payer: Self-pay | Admitting: Certified Nurse Midwife

## 2017-05-17 ENCOUNTER — Emergency Department (HOSPITAL_COMMUNITY): Payer: 59

## 2017-05-17 ENCOUNTER — Encounter (HOSPITAL_COMMUNITY): Payer: Self-pay | Admitting: Emergency Medicine

## 2017-05-17 ENCOUNTER — Other Ambulatory Visit: Payer: Self-pay

## 2017-05-17 ENCOUNTER — Observation Stay (HOSPITAL_COMMUNITY)
Admit: 2017-05-17 | Discharge: 2017-05-17 | Disposition: A | Discharge status: Home / Self Care | Payer: 59 | Attending: Internal Medicine | Admitting: Internal Medicine

## 2017-05-17 ENCOUNTER — Observation Stay (HOSPITAL_COMMUNITY)
Admission: EM | Admit: 2017-05-17 | Discharge: 2017-05-18 | Disposition: A | Discharge status: Home / Self Care | Payer: 59 | Attending: Internal Medicine | Admitting: Internal Medicine

## 2017-05-17 DIAGNOSIS — Z79899 Other long term (current) drug therapy: Secondary | ICD-10-CM | POA: Diagnosis not present

## 2017-05-17 DIAGNOSIS — G40909 Epilepsy, unspecified, not intractable, without status epilepticus: Secondary | ICD-10-CM | POA: Diagnosis not present

## 2017-05-17 DIAGNOSIS — R4182 Altered mental status, unspecified: Secondary | ICD-10-CM | POA: Diagnosis present

## 2017-05-17 DIAGNOSIS — I951 Orthostatic hypotension: Secondary | ICD-10-CM

## 2017-05-17 DIAGNOSIS — R569 Unspecified convulsions: Secondary | ICD-10-CM

## 2017-05-17 DIAGNOSIS — G40901 Epilepsy, unspecified, not intractable, with status epilepticus: Principal | ICD-10-CM | POA: Insufficient documentation

## 2017-05-17 DIAGNOSIS — R55 Syncope and collapse: Secondary | ICD-10-CM | POA: Diagnosis not present

## 2017-05-17 DIAGNOSIS — I10 Essential (primary) hypertension: Secondary | ICD-10-CM | POA: Insufficient documentation

## 2017-05-17 HISTORY — DX: Unspecified convulsions: R56.9

## 2017-05-17 LAB — CBC WITH DIFFERENTIAL/PLATELET
BASOS ABS: 0 10*3/uL (ref 0.0–0.1)
BASOS PCT: 1 %
Eosinophils Absolute: 0 10*3/uL (ref 0.0–0.7)
Eosinophils Relative: 1 %
HEMATOCRIT: 39.5 % (ref 36.0–46.0)
Hemoglobin: 12.8 g/dL (ref 12.0–15.0)
Lymphocytes Relative: 15 %
Lymphs Abs: 1 10*3/uL (ref 0.7–4.0)
MCH: 31.4 pg (ref 26.0–34.0)
MCHC: 32.4 g/dL (ref 30.0–36.0)
MCV: 96.8 fL (ref 78.0–100.0)
MONO ABS: 0.4 10*3/uL (ref 0.1–1.0)
Monocytes Relative: 6 %
NEUTROS ABS: 5.1 10*3/uL (ref 1.7–7.7)
NEUTROS PCT: 77 %
Platelets: 238 10*3/uL (ref 150–400)
RBC: 4.08 MIL/uL (ref 3.87–5.11)
RDW: 13.5 % (ref 11.5–15.5)
WBC: 6.6 10*3/uL (ref 4.0–10.5)

## 2017-05-17 LAB — URINALYSIS, COMPLETE (UACMP) WITH MICROSCOPIC
Bacteria, UA: NONE SEEN
Bilirubin Urine: NEGATIVE
Glucose, UA: NEGATIVE mg/dL
HGB URINE DIPSTICK: NEGATIVE
KETONES UR: NEGATIVE mg/dL
Leukocytes, UA: NEGATIVE
NITRITE: NEGATIVE
PH: 8 (ref 5.0–8.0)
Protein, ur: NEGATIVE mg/dL
RBC / HPF: NONE SEEN RBC/hpf (ref 0–5)
Specific Gravity, Urine: 1.008 (ref 1.005–1.030)

## 2017-05-17 LAB — RAPID URINE DRUG SCREEN, HOSP PERFORMED
Amphetamines: NOT DETECTED
BARBITURATES: NOT DETECTED
Benzodiazepines: NOT DETECTED
COCAINE: NOT DETECTED
OPIATES: NOT DETECTED
Tetrahydrocannabinol: NOT DETECTED

## 2017-05-17 LAB — ETHANOL: Alcohol, Ethyl (B): <10 mg/dL (ref <10)

## 2017-05-17 LAB — TROPONIN I: Troponin I: <0.03 ng/mL (ref <0.03)

## 2017-05-17 LAB — COMPREHENSIVE METABOLIC PANEL
ALBUMIN: 4.2 g/dL (ref 3.5–5.0)
ALT: 18 U/L (ref 14–54)
AST: 31 U/L (ref 15–41)
Alkaline Phosphatase: 68 U/L (ref 38–126)
Anion gap: 13 (ref 5–15)
BILIRUBIN TOTAL: 0.5 mg/dL (ref 0.3–1.2)
BUN: 12 mg/dL (ref 6–20)
CHLORIDE: 107 mmol/L (ref 101–111)
CO2: 23 mmol/L (ref 22–32)
Calcium: 9.4 mg/dL (ref 8.9–10.3)
Creatinine, Ser: 0.95 mg/dL (ref 0.44–1.00)
GFR calc Af Amer: >60 mL/min (ref >60)
GFR calc non Af Amer: >60 mL/min (ref >60)
Glucose, Bld: 103 mg/dL — ABNORMAL HIGH (ref 65–99)
POTASSIUM: 4.6 mmol/L (ref 3.5–5.1)
Sodium: 143 mmol/L (ref 135–145)
TOTAL PROTEIN: 7 g/dL (ref 6.5–8.1)

## 2017-05-17 LAB — CBG MONITORING, ED: Glucose-Capillary: 104 mg/dL — ABNORMAL HIGH (ref 65–99)

## 2017-05-17 LAB — CK: Total CK: 109 U/L (ref 38–234)

## 2017-05-17 MED ORDER — LORAZEPAM 2 MG/ML IJ SOLN
1.0000 mg | Freq: Once | INTRAMUSCULAR | Status: AC
  Administered 2017-05-17: 1 mg via INTRAVENOUS

## 2017-05-17 MED ORDER — CALCIUM CARBONATE-VITAMIN D 500-200 MG-UNIT PO TABS
1.0000 | ORAL_TABLET | Freq: Every day | ORAL | Status: DC
  Administered 2017-05-18: 10:00:00 1 via ORAL
  Filled 2017-05-17: qty 1

## 2017-05-17 MED ORDER — ENOXAPARIN SODIUM 40 MG/0.4ML ~~LOC~~ SOLN
40.0000 mg | SUBCUTANEOUS | Status: DC
  Administered 2017-05-17: 40 mg via SUBCUTANEOUS
  Filled 2017-05-17: qty 0.4

## 2017-05-17 MED ORDER — SERTRALINE HCL 50 MG PO TABS
100.0000 mg | ORAL_TABLET | Freq: Every day | ORAL | Status: DC
  Administered 2017-05-18: 100 mg via ORAL
  Filled 2017-05-17: qty 2

## 2017-05-17 MED ORDER — ACETAMINOPHEN 650 MG RE SUPP: 650.0000 mg | Freq: Four times a day (QID) | RECTAL | Status: DC | PRN

## 2017-05-17 MED ORDER — LORAZEPAM 2 MG/ML IJ SOLN: 2.0000 mg | Freq: Four times a day (QID) | INTRAMUSCULAR | Status: DC | PRN

## 2017-05-17 MED ORDER — MONTELUKAST SODIUM 10 MG PO TABS
10.0000 mg | ORAL_TABLET | Freq: Every day | ORAL | Status: DC
  Administered 2017-05-17: 10 mg via ORAL
  Filled 2017-05-17: qty 1

## 2017-05-17 MED ORDER — ONDANSETRON HCL 4 MG PO TABS: 4.0000 mg | ORAL_TABLET | Freq: Four times a day (QID) | ORAL | Status: DC | PRN

## 2017-05-17 MED ORDER — LEVETIRACETAM 500 MG PO TABS
500.0000 mg | ORAL_TABLET | Freq: Two times a day (BID) | ORAL | Status: DC
  Administered 2017-05-17 – 2017-05-18 (×2): 500 mg via ORAL
  Filled 2017-05-17 (×2): qty 1

## 2017-05-17 MED ORDER — LEVETIRACETAM IN NACL 1000 MG/100ML IV SOLN
1000.0000 mg | Freq: Once | INTRAVENOUS | Status: AC
  Administered 2017-05-17: 1000 mg via INTRAVENOUS
  Filled 2017-05-17: qty 100

## 2017-05-17 MED ORDER — ACETAMINOPHEN 325 MG PO TABS: 650.0000 mg | ORAL_TABLET | Freq: Four times a day (QID) | ORAL | Status: DC | PRN

## 2017-05-17 MED ORDER — GABAPENTIN 300 MG PO CAPS
600.0000 mg | ORAL_CAPSULE | Freq: Every day | ORAL | Status: DC
  Administered 2017-05-17: 600 mg via ORAL
  Filled 2017-05-17: qty 2

## 2017-05-17 MED ORDER — LORAZEPAM 2 MG/ML IJ SOLN
INTRAMUSCULAR | Status: AC
  Administered 2017-05-17: 1 mg via INTRAVENOUS
  Filled 2017-05-17: qty 1

## 2017-05-17 MED ORDER — ONDANSETRON HCL 4 MG/2ML IJ SOLN: 4.0000 mg | Freq: Four times a day (QID) | INTRAMUSCULAR | Status: DC | PRN

## 2017-05-17 NOTE — ED Notes (Signed)
Pt rang call bell. In to assess and pt is a/o. Wanted to talk to husband. Husband called and now is talking to her

## 2017-05-17 NOTE — ED Triage Notes (Signed)
Pt reports she woke up dizzy this morning and fell at home while getting ready for work.  She put her back brace on due to fall.  Was at work and was not feeling well and a bystander walked by the store front and saw her lying on the ground jerking.  Pt bit her lip and continues to exhibit intermittent jerking motions of extremities.  Is confused to time and date, oriented to place.  Cannot recall most of this morning's events.
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
## 2017-05-17 NOTE — ED Notes (Signed)
Going with pt to CT. Pt still unresponsive. Pt placed on 2L [place]

## 2017-05-17 NOTE — ED Notes (Signed)
Spoke with pt's husband who is coming from [place].  Updated on situation.

## 2017-05-17 NOTE — ED Notes (Signed)
Pt started having decorticate postering then started moving and mildly alert and following commands.

## 2017-05-17 NOTE — Progress Notes (Signed)
EEG Completed; Results Pending  

## 2017-05-17 NOTE — ED Notes (Signed)
Returned back with pt from CT.
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
## 2017-05-17 NOTE — ED Notes (Signed)
Pt began having generalized tonic clonic seizure activity lasting about 30 seconds with desaturation.  Placed on NRB and MD called to bedside.  Ativan 1mg  IVP given by Benay Pillow, RN.
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
## 2017-05-17 NOTE — Procedures (Signed)
Date of recording 05/17/17  Referring physician Shanon Brow Tat  Indication Seizures  Technical Digital EEG recording using 10-20 international electrode system  Description of the recording Posterior dominant rhythm is 8-9 Hz symmetrical reactive and well sustained Sleep was not obtained No localizing, lateralizing or interictal findings were seen  Impression The EEG is normal in patient recorded in awake and drowsy states.

## 2017-05-18 ENCOUNTER — Observation Stay (HOSPITAL_BASED_OUTPATIENT_CLINIC_OR_DEPARTMENT_OTHER): Payer: 59

## 2017-05-18 DIAGNOSIS — I951 Orthostatic hypotension: Secondary | ICD-10-CM | POA: Diagnosis not present

## 2017-05-18 DIAGNOSIS — R55 Syncope and collapse: Secondary | ICD-10-CM | POA: Diagnosis not present

## 2017-05-18 DIAGNOSIS — G40909 Epilepsy, unspecified, not intractable, without status epilepticus: Secondary | ICD-10-CM | POA: Diagnosis not present

## 2017-05-18 LAB — ECHOCARDIOGRAM COMPLETE
CHL CUP STROKE VOLUME: 27 mL
E decel time: 208 msec
E/e' ratio: 6.83
FS: 36 % (ref 28–44)
HEIGHTINCHES: 66 in
IVS/LV PW RATIO, ED: 1.34
LA ID, A-P, ES: 31 mm
LA diam index: 1.72 cm/m2
LA vol A4C: 33.9 ml
LAVOL: 42.3 mL
LAVOLIN: 23.4 mL/m2
LDCA: 3.14 cm2
LEFT ATRIUM END SYS DIAM: 31 mm
LV E/e' medial: 6.83
LV E/e'average: 6.83
LV PW d: 8.45 mm — AB (ref 0.6–1.1)
LV dias vol: 45 mL — AB (ref 46–106)
LV sys vol index: 10 mL/m2
LVDIAVOLIN: 25 mL/m2
LVELAT: 9.14 cm/s
LVOT VTI: 26.4 cm
LVOT peak grad rest: 6 mmHg
LVOTD: 20 mm
LVOTPV: 125 cm/s
LVOTSV: 83 mL
LVSYSVOL: 18 mL (ref 14–42)
MV Dec: 208
MV pk A vel: 73.5 m/s
MV pk E vel: 62.4 m/s
RV LATERAL S' VELOCITY: 9.79 cm/s
Simpson's disk: 60
TAPSE: 19.7 mm
TDI e' lateral: 9.14
TDI e' medial: 7.18
WEIGHTICAEL: 2444.46 [oz_av]

## 2017-05-18 LAB — HIV ANTIBODY (ROUTINE TESTING W REFLEX): HIV SCREEN 4TH GENERATION: NONREACTIVE

## 2017-05-18 MED ORDER — LEVETIRACETAM 500 MG PO TABS: 500.0000 mg | ORAL_TABLET | Freq: Two times a day (BID) | ORAL | 1 refills | Status: DC

## 2017-05-18 MED ORDER — SODIUM CHLORIDE 0.9 % IV BOLUS (SEPSIS)
1000.0000 mL | Freq: Once | INTRAVENOUS | Status: AC
  Administered 2017-05-18: 1000 mL via INTRAVENOUS

## 2017-05-18 MED ORDER — SALINE SPRAY 0.65 % NA SOLN
1.0000 | NASAL | Status: DC | PRN
  Administered 2017-05-18: 1 via NASAL
  Filled 2017-05-18: qty 44

## 2017-05-18 NOTE — Progress Notes (Signed)
*  PRELIMINARY RESULTS* Echocardiogram 2D Echocardiogram has been performed.  Sarah Fowler 05/18/2017, 11:41 AM

## 2017-05-18 NOTE — Progress Notes (Signed)
Removed IV-clean, dry, intact. Reviewed d/c paperwork with pt. Discussed new meds and answered all questions. Sarah Fowler the NT wheeled stable pt and belongings to car where her son picked her up.

## 2017-05-18 NOTE — Discharge Summary (Addendum)
Physician Discharge Summary  Sarah Fowler TKZ:[id number] DOB: 1960-10-31 DOA: 05/17/2017  PCP: Katherina Mires, MD  Admit date: 05/17/2017 Discharge date: 05/18/2017  Admitted From: Home Disposition:  Home  Recommendations for Outpatient Follow-up:  1. Follow up with PCP in 1-2 weeks 2. Please obtain BMP/CBC in one week   Discharge Condition: Stable CODE STATUS: FULL Diet recommendation: Heart Healthy   Brief/Interim Summary: 57 y.o. female with medical history of depression, hypertension, and osteoporosis presenting with seizure activity while she was at work.  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  She began calling for her manager.  That was the last thing she remembered prior to her syncopal episode.  Apparently, the patient had tonic-clonic type activity.  EMS was activated, the patient was brought to emergency department where she had another tonic-clonic type seizure lasting approximately 1 minute.  The patient was given Ativan and started on a Keppra load.  At the time of my interview, the patient is alert and oriented x4.  She states that she had a similar episode approximately in January 2017.  She had MRI of the brain at that time which was negative for any structural abnormalities.  She was not started on any AEDs at that time.  She has not had any seizure-like activity since that period of time.  The patient denies any alcohol or illegal drug use.  She denies any new medications.  However, the patient has been drinking some type of energy drink called Zip Fizz for the past 2-3 months.  She has been drinking approximately 3 drinks per day for weight loss.  Prior to today's events, the patient had been in her usual state of health.  The patient denied any bowel or bladder incontinence, but she bit her lip causing a small laceration on the right lower lip.  She had denied any fevers, chills, headache, chest  pain, shortness breath, nausea, vomiting, diarrhea, abdominal pain, dysuria, hematuria.  In the emergency department, the patient was afebrile hemodynamically stable.  She was saturating 100% on 2 L.  BMP, LFTs, and CBC were unremarkable.  Initial troponin was negative.  EKG shows sinus rhythm with nonspecific T wave changes.  CT of the brain was negative for acute findings.    Discharge Diagnoses:  Syncope -secondary to seizure -check orthostatics-->positive -symptomatically improved after IVF -maintain on tele--no concerning dysrhythmia -Echo--EF 60-65%, grade 1 DD, no WMA  Seizure -?related to her energy drink ("zip fizz") -EEG--no seizure predisposition -Neurology consult appreciated-->no need to repeat MRI, start kepra -04/12/15 MR brain negative for structural abnormalities -continue Keppra -Ativan prn seizure -check CK--109 -UDS--neg -UA neg for pyuria -I informed pt that she is not able to drive for at least 6 months, until cleared by neurology  Depression -Continue Zoloft     Discharge Instructions  Discharge Instructions    Diet - low sodium heart healthy   Complete by:  As directed    Increase activity slowly   Complete by:  As directed      Allergies as of 05/18/2017      Reactions   Aspirin    Pt gets migraines    Chocolate    Migraines    Cinnamon    Triggers migraines   Depakote [valproic Acid]    depression   Flexeril [cyclobenzaprine] Other (See Comments)   Syncope and weakness   Morphine And Related    faints  Other Other (See Comments)   Blood pressure cuff burns skin to the point it hurts muscles in arm and skin flaking off, redness, blisters    Percocet [oxycodone-acetaminophen] Itching   Sugar-protein-starch Other (See Comments)   Migraine, sugar substitutes, artificial sugars   Tape Other (See Comments)   Blisters   Alendronate Sodium Other (See Comments)   Severe heartburn and indigestion.   Prednisone Itching, Rash   Patient  took this medication with flexeril and had a syncopal episode. She is unsure if it was from the flexeril or the prednisone.      Medication List    STOP taking these medications   valACYclovir 1000 MG tablet Commonly known as:  VALTREX   verapamil 120 MG CR tablet Commonly known as:  CALAN-SR     TAKE these medications   alendronate 70 MG tablet Commonly known as:  FOSAMAX Take 70 mg by mouth once a week. Wednesday   CALCIUM 600-D PO Take 2 tablets by mouth daily.   conjugated estrogens vaginal cream Commonly known as:  PREMARIN Use 1/2 g vaginally every night at bed time for the first 2 weeks, then use 1/2 g vaginally two or three times per week .   gabapentin 300 MG capsule Commonly known as:  NEURONTIN Take 600 mg by mouth at bedtime.   ibuprofen 800 MG tablet Commonly known as:  ADVIL,MOTRIN TAKE 1 TABLET(800 MG) BY MOUTH EVERY 8 HOURS AS NEEDED   levETIRAcetam 500 MG tablet Commonly known as:  KEPPRA Take 1 tablet (500 mg total) by mouth 2 (two) times daily.   montelukast 10 MG tablet Commonly known as:  SINGULAIR Take 10 mg by mouth at bedtime.   MULTIVITAMIN PO Take 1 tablet by mouth daily.   sertraline 100 MG tablet Commonly known as:  ZOLOFT Take 100 mg by mouth daily.   zolmitriptan 5 MG tablet Commonly known as:  ZOMIG Take 5 mg by mouth as needed for migraine (q2gr not to exceed 10mg  daily).       Allergies  Allergen Reactions  . Aspirin     Pt gets migraines   . Chocolate     Migraines   . Cinnamon     Triggers migraines  . Depakote [Valproic Acid]     depression  . Flexeril [Cyclobenzaprine] Other (See Comments)    Syncope and weakness  . Morphine And Related     faints  . Other Other (See Comments)    Blood pressure cuff burns skin to the point it hurts muscles in arm and skin flaking off, redness, blisters   . Percocet [Oxycodone-Acetaminophen] Itching  . Sugar-Protein-Starch Other (See Comments)    Migraine, sugar substitutes,  artificial sugars   . Tape Other (See Comments)    Blisters  . Alendronate Sodium Other (See Comments)    Severe heartburn and indigestion.  . Prednisone Itching and Rash    Patient took this medication with flexeril and had a syncopal episode. She is unsure if it was from the flexeril or the prednisone.    Consultations:  neurology   Procedures/Studies: Ct Head Wo Contrast  Result Date: 05/17/2017 CLINICAL DATA:  Altered level of consciousness EXAM: CT HEAD WITHOUT CONTRAST TECHNIQUE: Contiguous axial images were obtained from the base of the skull through the vertex without intravenous contrast. COMPARISON:  04/12/2015 FINDINGS: Brain: No acute intracranial abnormality. Specifically, no hemorrhage, hydrocephalus, mass lesion, acute infarction, or significant intracranial injury. Vascular: No hyperdense vessel or unexpected calcification. Skull: Is Sinuses/Orbits: Mucosal  thickening in the right maxillary sinus. Mastoid air cells are clear. Orbital soft tissues unremarkable. Other: None IMPRESSION: No acute intracranial abnormality. Electronically Signed   By: Rolm Baptise M.D.   On: 05/17/2017 11:59         Discharge Exam: Vitals:   05/18/17 0448 05/18/17 1338  BP: 123/67 (!) 120/58  Pulse: 71 90  Resp: 16 16  Temp: 97.7 F (36.5 C) 99 F (37.2 C)  SpO2: 98% 94%   Vitals:   05/17/17 1700 05/17/17 2058 05/18/17 0448 05/18/17 1338  BP: 132/67  123/67 (!) 120/58  Pulse: 75  71 90  Resp: 18 16 16 16   Temp: 98.3 F (36.8 C) 98.2 F (36.8 C) 97.7 F (36.5 C) 99 F (37.2 C)  TempSrc: Oral Oral Oral Oral  SpO2: 97% 98% 98% 94%  Weight: 69.3 kg (152 lb 12.5 oz)     Height: 5\' 6"  (1.676 m)       General: Pt is alert, awake, not in acute distress Cardiovascular: RRR, S1/S2 +, no rubs, no gallops Respiratory: CTA bilaterally, no wheezing, no rhonchi Abdominal: Soft, NT, ND, bowel sounds + Extremities: no edema, no cyanosis   The results of significant diagnostics  from this hospitalization (including imaging, microbiology, ancillary and laboratory) are listed below for reference.    Significant Diagnostic Studies: Ct Head Wo Contrast  Result Date: 05/17/2017 CLINICAL DATA:  Altered level of consciousness EXAM: CT HEAD WITHOUT CONTRAST TECHNIQUE: Contiguous axial images were obtained from the base of the skull through the vertex without intravenous contrast. COMPARISON:  04/12/2015 FINDINGS: Brain: No acute intracranial abnormality. Specifically, no hemorrhage, hydrocephalus, mass lesion, acute infarction, or significant intracranial injury. Vascular: No hyperdense vessel or unexpected calcification. Skull: Is Sinuses/Orbits: Mucosal thickening in the right maxillary sinus. Mastoid air cells are clear. Orbital soft tissues unremarkable. Other: None IMPRESSION: No acute intracranial abnormality. Electronically Signed   By: Rolm Baptise M.D.   On: 05/17/2017 11:59     Microbiology: No results found for this or any previous visit (from the past 240 hour(s)).   Labs: Basic Metabolic Panel: Recent Labs  Lab 05/17/17 1206  NA 143  K 4.6  CL 107  CO2 23  GLUCOSE 103*  BUN 12  CREATININE 0.95  CALCIUM 9.4   Liver Function Tests: Recent Labs  Lab 05/17/17 1206  AST 31  ALT 18  ALKPHOS 68  BILITOT 0.5  PROT 7.0  ALBUMIN 4.2   No results for input(s): LIPASE, AMYLASE in the last 168 hours. No results for input(s): AMMONIA in the last 168 hours. CBC: Recent Labs  Lab 05/17/17 1206  WBC 6.6  NEUTROABS 5.1  HGB 12.8  HCT 39.5  MCV 96.8  PLT 238   Cardiac Enzymes: Recent Labs  Lab 05/17/17 1206  CKTOTAL 109  TROPONINI <0.03   BNP: Invalid input(s): POCBNP CBG: Recent Labs  Lab 05/17/17 1148  GLUCAP 104*    Time coordinating discharge:  Greater than 30 minutes  Signed:  Orson Eva, DO [facility] Pager: [phone] 05/18/2017, 2:18 PM

## 2017-05-19 LAB — URINE CULTURE: CULTURE: NO GROWTH

## 2017-05-31 ENCOUNTER — Encounter: Payer: Self-pay | Admitting: Certified Nurse Midwife

## 2017-06-01 ENCOUNTER — Encounter: Payer: Self-pay | Admitting: Certified Nurse Midwife

## 2017-06-05 ENCOUNTER — Ambulatory Visit: Payer: 59 | Admitting: Certified Nurse Midwife

## 2017-06-12 ENCOUNTER — Encounter: Payer: Self-pay | Admitting: Certified Nurse Midwife

## 2017-06-12 ENCOUNTER — Other Ambulatory Visit: Payer: Self-pay

## 2017-06-12 ENCOUNTER — Ambulatory Visit (INDEPENDENT_AMBULATORY_CARE_PROVIDER_SITE_OTHER): Payer: 59 | Admitting: Certified Nurse Midwife

## 2017-06-12 VITALS — BP 124/70 | HR 64 | Resp 16 | Ht 65.0 in | Wt 136.0 lb

## 2017-06-12 DIAGNOSIS — Z87898 Personal history of other specified conditions: Secondary | ICD-10-CM

## 2017-06-12 DIAGNOSIS — N951 Menopausal and female climacteric states: Secondary | ICD-10-CM | POA: Diagnosis not present

## 2017-06-12 DIAGNOSIS — Z01419 Encounter for gynecological examination (general) (routine) without abnormal findings: Secondary | ICD-10-CM
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
## 2017-06-18 ENCOUNTER — Telehealth: Payer: Self-pay | Admitting: Certified Nurse Midwife

## 2017-06-18 ENCOUNTER — Encounter: Payer: Self-pay | Admitting: Certified Nurse Midwife

## 2017-06-18 NOTE — Telephone Encounter (Signed)
Message   ----- Message from [place], Generic sent at 06/18/2017 7:54 AM EDT -----    I've tried calling [facility] several times before I finally got to talk to someone in medical records. They told me that they couldn't give me my medical records that my doctor would have to request them. I couldn't get any where with them. I looked up Doctor Shanon Brow Tat and he isn't even neurologist he's an infection specialist. Could you please find me a really good neurologist and give me a referral? I sure would appreciate it.    Thank You,  Sarah Fowler

## 2017-06-18 NOTE — Telephone Encounter (Signed)
Left message to call [place] at [phone].  Per Melvia Heaps CNM okay for referral to Neurology.

## 2017-06-18 NOTE — Telephone Encounter (Signed)
Patient was seen by Dr.David Tat for evaluation at [facility] on 05/18/2017. Dr.David Tat is an internal medicine doctor. Patient was due to follow up with PCP, but PCP declines to see her for follow up of seizures. Please advise on referral to Neurology. [name] notes from 05/18/2017 are available in [facility].

## 2017-07-03 ENCOUNTER — Encounter: Payer: Self-pay | Admitting: Certified Nurse Midwife

## 2017-07-11 NOTE — Telephone Encounter (Signed)
Ok to close

## 2017-07-11 NOTE — Telephone Encounter (Signed)
Okay to close encounter?  °
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
## 2018-06-18 ENCOUNTER — Ambulatory Visit: Payer: PRIVATE HEALTH INSURANCE | Admitting: Certified Nurse Midwife

## 2018-06-18 ENCOUNTER — Other Ambulatory Visit: Payer: Self-pay

## 2018-06-18 ENCOUNTER — Encounter: Payer: Self-pay | Admitting: Certified Nurse Midwife

## 2018-06-18 VITALS — BP 122/82 | HR 68 | Temp 97.7°F | Resp 16 | Ht 64.75 in | Wt 140.0 lb

## 2018-06-18 DIAGNOSIS — Z01419 Encounter for gynecological examination (general) (routine) without abnormal findings: Secondary | ICD-10-CM

## 2018-06-18 DIAGNOSIS — Z8739 Personal history of other diseases of the musculoskeletal system and connective tissue: Secondary | ICD-10-CM

## 2018-06-18 DIAGNOSIS — N951 Menopausal and female climacteric states: Secondary | ICD-10-CM | POA: Diagnosis not present
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
## 2019-05-25 DIAGNOSIS — F4320 Adjustment disorder, unspecified: Secondary | ICD-10-CM | POA: Insufficient documentation

## 2019-05-25 DIAGNOSIS — Z8709 Personal history of other diseases of the respiratory system: Secondary | ICD-10-CM | POA: Insufficient documentation

## 2019-05-25 DIAGNOSIS — M25552 Pain in left hip: Secondary | ICD-10-CM | POA: Insufficient documentation

## 2019-06-23 ENCOUNTER — Encounter: Payer: Self-pay | Admitting: Certified Nurse Midwife
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
## 2019-06-24 ENCOUNTER — Encounter: Payer: Self-pay | Admitting: Certified Nurse Midwife

## 2019-06-24 ENCOUNTER — Ambulatory Visit (INDEPENDENT_AMBULATORY_CARE_PROVIDER_SITE_OTHER): Payer: BC Managed Care – PPO | Admitting: Certified Nurse Midwife

## 2019-06-24 ENCOUNTER — Other Ambulatory Visit: Payer: Self-pay

## 2019-06-24 VITALS — BP 122/80 | HR 68 | Temp 97.8°F | Resp 16 | Ht 64.5 in | Wt 152.0 lb

## 2019-06-24 DIAGNOSIS — Z01419 Encounter for gynecological examination (general) (routine) without abnormal findings: Secondary | ICD-10-CM

## 2019-06-24 NOTE — Patient Instructions (Signed)
EXERCISE AND DIET:  We recommended that you start or continue a regular exercise program for good health. Regular exercise means any activity that makes your heart beat faster and makes you sweat.  We recommend exercising at least 30 minutes per day at least 3 days a week, preferably 4 or 5.  We also recommend a diet low in fat and sugar.  Inactivity, poor dietary choices and obesity can cause diabetes, heart attack, stroke, and kidney damage, among others.    ALCOHOL AND SMOKING:  Women should limit their alcohol intake to no more than 7 drinks/beers/glasses of wine (combined, not each!) per week. Moderation of alcohol intake to this level decreases your risk of breast cancer and liver damage. And of course, no recreational drugs are part of a healthy lifestyle.  And absolutely no smoking or even second hand smoke. Most people know smoking can cause heart and lung diseases, but did you know it also contributes to weakening of your bones? Aging of your skin?  Yellowing of your teeth and nails?  CALCIUM AND VITAMIN D:  Adequate intake of calcium and Vitamin D are recommended.  The recommendations for exact amounts of these supplements seem to change often, but generally speaking 600 mg of calcium (either carbonate or citrate) and 800 units of Vitamin D per day seems prudent. Certain women may benefit from higher intake of Vitamin D.  If you are among these women, your doctor will have told you during your visit.    PAP SMEARS:  Pap smears, to check for cervical cancer or precancers,  have traditionally been done yearly, although recent scientific advances have shown that most women can have pap smears less often.  However, every woman still should have a physical exam from her gynecologist every year. It will include a breast check, inspection of the vulva and vagina to check for abnormal growths or skin changes, a visual exam of the cervix, and then an exam to evaluate the size and shape of the uterus and  ovaries.  And after 61 years of age, a rectal exam is indicated to check for rectal cancers. We will also provide age appropriate advice regarding health maintenance, like when you should have certain vaccines, screening for sexually transmitted diseases, bone density testing, colonoscopy, mammograms, etc.   MAMMOGRAMS:  All women over 61 years old should have a yearly mammogram. Many facilities now offer a "3D" mammogram, which may cost around $50 extra out of pocket. If possible,  we recommend you accept the option to have the 3D mammogram performed.  It both reduces the number of women who will be called back for extra views which then turn out to be normal, and it is better than the routine mammogram at detecting truly abnormal areas.    COLONOSCOPY:  Colonoscopy to screen for colon cancer is recommended for all women at age 61.  We know, you hate the idea of the prep.  We agree, BUT, having colon cancer and not knowing it is worse!!  Colon cancer so often starts as a polyp that can be seen and removed at colonscopy, which can quite literally save your life!  And if your first colonoscopy is normal and you have no family history of colon cancer, most women don't have to have it again for 10 years.  Once every ten years, you can do something that may end up saving your life, right?  We will be happy to help you get it scheduled when you are ready.  Be sure to check your insurance coverage so you understand how much it will cost.  It may be covered as a preventative service at no cost, but you should check your particular policy.      Osteoporosis  Osteoporosis happens when your bones get thin and weak. This can cause your bones to break (fracture) more easily. You can do things at home to make your bones stronger. Follow these instructions at home:  Activity  Exercise as told by your doctor. Ask your doctor what activities are safe for you. You should do: ? Exercises that make your muscles work to  hold your body weight up (weight-bearing exercises). These include tai chi, yoga, and walking. ? Exercises to make your muscles stronger. One example is lifting weights. Lifestyle  Limit alcohol intake to no more than 1 drink a day for nonpregnant women and 2 drinks a day for men. One drink equals 12 oz of beer, 5 oz of wine, or 1 oz of hard liquor.  Do not use any products that have nicotine or tobacco in them. These include cigarettes and e-cigarettes. If you need help quitting, ask your doctor. Preventing falls  Use tools to help you move around (mobility aids) as needed. These include canes, walkers, scooters, and crutches.  Keep rooms well-lit and free of clutter.  Put away things that could make you trip. These include cords and rugs.  Install safety rails on stairs. Install grab bars in bathrooms.  Use rubber mats in slippery areas, like bathrooms.  Wear shoes that: ? Fit you well. ? Support your feet. ? Have closed toes. ? Have rubber soles or low heels.  Tell your doctor about all of the medicines you are taking. Some medicines can make you more likely to fall. General instructions  Eat plenty of calcium and vitamin D. These nutrients are good for your bones. Good sources of calcium and vitamin D include: ? Some fatty fish, such as salmon and tuna. ? Foods that have calcium and vitamin D added to them (fortified foods). For example, some breakfast cereals are fortified with calcium and vitamin D. ? Egg yolks. ? Cheese. ? Liver.  Take over-the-counter and prescription medicines only as told by your doctor.  Keep all follow-up visits as told by your doctor. This is important. Contact a doctor if:  You have not been tested (screened) for osteoporosis and you are: ? A woman who is age 61 or older. ? A man who is age 61 or older. Get help right away if:  You fall.  You get hurt. Summary  Osteoporosis happens when your bones get thin and weak.  Weak bones can  break (fracture) more easily.  Eat plenty of calcium and vitamin D. These nutrients are good for your bones.  Tell your doctor about all of the medicines that you take. This information is not intended to replace advice given to you by your health care provider. Make sure you discuss any questions you have with your health care provider. Document Revised: 03/01/2017 Document Reviewed: 01/11/2017 Elsevier Patient Education  2020 [employer].

## 2019-10-29 ENCOUNTER — Encounter: Payer: Self-pay | Admitting: Certified Nurse Midwife

## 2020-01-08 ENCOUNTER — Encounter: Payer: Self-pay | Admitting: Obstetrics and Gynecology

## 2020-06-29 ENCOUNTER — Encounter: Payer: Self-pay | Admitting: Obstetrics and Gynecology

## 2020-06-29 ENCOUNTER — Ambulatory Visit (INDEPENDENT_AMBULATORY_CARE_PROVIDER_SITE_OTHER): Payer: BC Managed Care – PPO | Admitting: Obstetrics and Gynecology

## 2020-06-29 ENCOUNTER — Ambulatory Visit: Payer: BC Managed Care – PPO | Admitting: Obstetrics and Gynecology

## 2020-06-29 ENCOUNTER — Other Ambulatory Visit: Payer: Self-pay

## 2020-06-29 VITALS — BP 132/78 | HR 66 | Ht 64.5 in | Wt 122.0 lb

## 2020-06-29 DIAGNOSIS — Z01419 Encounter for gynecological examination (general) (routine) without abnormal findings: Secondary | ICD-10-CM

## 2020-06-29 NOTE — Patient Instructions (Signed)
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
## 2020-09-16 ENCOUNTER — Encounter: Payer: Self-pay | Admitting: Obstetrics and Gynecology

## 2020-09-16 NOTE — Telephone Encounter (Signed)
Can you reach out to patient to schedule.

## 2020-09-19 ENCOUNTER — Telehealth: Payer: Self-pay | Admitting: Obstetrics and Gynecology

## 2020-09-19 ENCOUNTER — Other Ambulatory Visit: Payer: Self-pay

## 2020-09-19 ENCOUNTER — Ambulatory Visit: Payer: BC Managed Care – PPO | Admitting: Obstetrics and Gynecology

## 2020-09-19 ENCOUNTER — Encounter: Payer: Self-pay | Admitting: Obstetrics and Gynecology

## 2020-09-19 VITALS — BP 142/80 | HR 78 | Ht 64.5 in | Wt 124.0 lb

## 2020-09-19 DIAGNOSIS — N644 Mastodynia: Secondary | ICD-10-CM | POA: Diagnosis not present

## 2020-09-19 DIAGNOSIS — N6459 Other signs and symptoms in breast: Secondary | ICD-10-CM | POA: Diagnosis not present

## 2020-09-19 MED ORDER — IBUPROFEN 800 MG PO TABS: 800.0000 mg | ORAL_TABLET | Freq: Three times a day (TID) | ORAL | 0 refills | Status: AC | PRN

## 2020-09-19 NOTE — Telephone Encounter (Signed)
Please schedule a bilateral diagnostic mammogram and bilateral ultrasound at [facility].   Patient has left breast pain and she felt a mass, which is no longer palpable.  She also has inverted nipples bilaterally.   She would like to proceed forward as soon as possible.
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
## 2020-09-20 NOTE — Telephone Encounter (Signed)
Left message for patient to call to see if she wants imaging done at [facility].

## 2020-10-06 ENCOUNTER — Other Ambulatory Visit: Payer: Self-pay

## 2020-10-06 NOTE — Telephone Encounter (Signed)
I called patient and per DPR access note on file I left detailed message asking her to call me. When Dr. Quincy Simmonds saw her in office  09/19/20 for a breast problem and recommended diagnostic imaging. I asked her to call and let me know had that been scheduled and to check with her about how she is taking it and if she actually needs a refill now or if pharmacy just reached out because she is insurance eligible for it.   Dr. Quincy Simmonds wrote at her visit 09/19/20 "Motrin 800 mg every 8 hours prn pain.  She will take this only prn and not regularly.  I discussed potential increased risk of gastritis with taking Ibuprofen and Zoloft."

## 2021-01-16 ENCOUNTER — Encounter: Payer: Self-pay | Admitting: Obstetrics and Gynecology
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
## 2021-07-06 ENCOUNTER — Ambulatory Visit (INDEPENDENT_AMBULATORY_CARE_PROVIDER_SITE_OTHER): Payer: BC Managed Care – PPO | Admitting: Obstetrics and Gynecology

## 2021-07-06 ENCOUNTER — Encounter: Payer: Self-pay | Admitting: Obstetrics and Gynecology

## 2021-07-06 VITALS — BP 136/84 | HR 80 | Ht 64.5 in | Wt 119.0 lb

## 2021-07-06 DIAGNOSIS — N952 Postmenopausal atrophic vaginitis: Secondary | ICD-10-CM | POA: Diagnosis not present

## 2021-07-06 DIAGNOSIS — Z01419 Encounter for gynecological examination (general) (routine) without abnormal findings: Secondary | ICD-10-CM

## 2021-07-06 DIAGNOSIS — N3946 Mixed incontinence: Secondary | ICD-10-CM | POA: Diagnosis not present

## 2021-07-06 LAB — URINALYSIS W MICROSCOPIC + REFLEX CULTURE
Bacteria, UA: NONE SEEN /HPF
Bilirubin Urine: NEGATIVE
Glucose, UA: NEGATIVE
Hgb urine dipstick: NEGATIVE
Hyaline Cast: NONE SEEN /LPF
Ketones, ur: NEGATIVE
Leukocyte Esterase: NEGATIVE
Nitrites, Initial: NEGATIVE
Protein, ur: NEGATIVE
RBC / HPF: NONE SEEN /HPF (ref 0–2)
Specific Gravity, Urine: 1.015 (ref 1.001–1.035)
WBC, UA: NONE SEEN /HPF (ref 0–5)
pH: 8.5 — ABNORMAL HIGH (ref 5.0–8.0)

## 2021-07-06 LAB — NO CULTURE INDICATED

## 2021-07-06 MED ORDER — ESTRADIOL 0.1 MG/GM VA CREA: TOPICAL_CREAM | VAGINAL | 2 refills | Status: DC

## 2021-07-06 NOTE — Patient Instructions (Signed)

## 2021-08-17 ENCOUNTER — Ambulatory Visit: Payer: BC Managed Care – PPO | Admitting: Obstetrics and Gynecology

## 2021-08-17 ENCOUNTER — Encounter: Payer: Self-pay | Admitting: Obstetrics and Gynecology

## 2021-08-17 VITALS — BP 110/80 | HR 64 | Resp 12 | Ht 64.5 in | Wt 116.0 lb

## 2021-08-17 DIAGNOSIS — N952 Postmenopausal atrophic vaginitis: Secondary | ICD-10-CM

## 2021-08-17 DIAGNOSIS — Z87898 Personal history of other specified conditions: Secondary | ICD-10-CM | POA: Diagnosis not present

## 2021-08-17 MED ORDER — ESTRADIOL 0.1 MG/GM VA CREA: TOPICAL_CREAM | VAGINAL | 2 refills | Status: DC
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
## 2021-08-17 NOTE — Patient Instructions (Addendum)
Change your dosage of your estrogen cream to:  Estradiol cream, place 1 gram, per vagina at bedtime twice weekly.

## 2022-01-25 ENCOUNTER — Encounter: Payer: Self-pay | Admitting: Obstetrics and Gynecology

## 2022-06-23 HISTORY — PX: CARPAL TUNNEL RELEASE: SHX101
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
## 2022-07-09 ENCOUNTER — Telehealth: Payer: Self-pay

## 2022-07-09 ENCOUNTER — Ambulatory Visit (INDEPENDENT_AMBULATORY_CARE_PROVIDER_SITE_OTHER): Payer: BC Managed Care – PPO | Admitting: Obstetrics and Gynecology

## 2022-07-09 ENCOUNTER — Encounter: Payer: Self-pay | Admitting: Obstetrics and Gynecology

## 2022-07-09 VITALS — BP 110/72 | HR 74 | Ht 66.0 in | Wt 120.0 lb

## 2022-07-09 DIAGNOSIS — Z01419 Encounter for gynecological examination (general) (routine) without abnormal findings: Secondary | ICD-10-CM

## 2022-07-09 DIAGNOSIS — N952 Postmenopausal atrophic vaginitis: Secondary | ICD-10-CM

## 2022-07-09 MED ORDER — ESTRADIOL 0.1 MG/GM VA CREA: TOPICAL_CREAM | VAGINAL | 1 refills | Status: DC

## 2022-07-09 MED ORDER — ESTRADIOL 0.1 MG/GM VA CREA
TOPICAL_CREAM | VAGINAL | 1 refills | Status: DC
Start: 2022-07-09 — End: 2022-07-09

## 2022-07-09 NOTE — Patient Instructions (Signed)

## 2022-07-09 NOTE — Telephone Encounter (Signed)
Pharmacy called because they received 2 Rx's for Estradiol vaginal cream with two different sets on directions and they are calling to confirm which is correct.  Advised per office note "Refill of vaginal estrogen cream. I recommend 1/2 gram pv at hs three times per week. "

## 2023-02-18 LAB — HM DEXA SCAN

## 2023-02-20 ENCOUNTER — Encounter: Payer: Self-pay | Admitting: Obstetrics and Gynecology

## 2023-08-02 ENCOUNTER — Emergency Department (HOSPITAL_COMMUNITY)
Admission: EM | Admit: 2023-08-02 | Discharge: 2023-08-02 | Disposition: A | Discharge status: Home / Self Care | Attending: Emergency Medicine | Admitting: Emergency Medicine

## 2023-08-02 ENCOUNTER — Emergency Department (HOSPITAL_COMMUNITY)

## 2023-08-02 ENCOUNTER — Other Ambulatory Visit: Payer: Self-pay

## 2023-08-02 DIAGNOSIS — S59901A Unspecified injury of right elbow, initial encounter: Secondary | ICD-10-CM | POA: Diagnosis present

## 2023-08-02 DIAGNOSIS — W01198A Fall on same level from slipping, tripping and stumbling with subsequent striking against other object, initial encounter: Secondary | ICD-10-CM | POA: Insufficient documentation

## 2023-08-02 DIAGNOSIS — Y92812 Truck as the place of occurrence of the external cause: Secondary | ICD-10-CM | POA: Insufficient documentation

## 2023-08-02 DIAGNOSIS — S52021A Displaced fracture of olecranon process without intraarticular extension of right ulna, initial encounter for closed fracture: Secondary | ICD-10-CM

## 2023-08-02 DIAGNOSIS — S0101XA Laceration without foreign body of scalp, initial encounter: Secondary | ICD-10-CM | POA: Diagnosis not present

## 2023-08-02 DIAGNOSIS — S52024A Nondisplaced fracture of olecranon process without intraarticular extension of right ulna, initial encounter for closed fracture: Secondary | ICD-10-CM | POA: Insufficient documentation

## 2023-08-02 MED ORDER — HYDROCODONE-ACETAMINOPHEN 5-325 MG PO TABS: 1.0000 | ORAL_TABLET | Freq: Four times a day (QID) | ORAL | 0 refills | Status: DC | PRN

## 2023-08-02 MED ORDER — HYDROMORPHONE HCL 1 MG/ML IJ SOLN
1.0000 mg | Freq: Once | INTRAMUSCULAR | Status: AC
  Administered 2023-08-02: 1 mg via INTRAMUSCULAR
  Filled 2023-08-02: qty 1

## 2023-08-02 MED ORDER — HYDROCODONE-ACETAMINOPHEN 5-325 MG PO TABS
2.0000 | ORAL_TABLET | Freq: Once | ORAL | Status: AC
  Administered 2023-08-02: 2 via ORAL
  Filled 2023-08-02: qty 2

## 2023-08-02 NOTE — Discharge Instructions (Addendum)
 As we discussed, you have olecranon fracture.  Please keep the splint in place and follow-up with orthopedic doctor  I has prescribed Vicodin for pain  You also have staples placed that needs to be removed in a week  Return to ER if you have severe pain or vomiting

## 2023-08-02 NOTE — ED Triage Notes (Signed)
 Patient was stepping off the tailgate of a truck onto a stool, and the stool flipped over, causing her to fall. She states she struck the back of her head on the corner of a metal work table-copious amounts of blood from same, though bleeding controlled at time of triage. Denies LOC. No blood thinners. Also complains of R elbow pain and skin tear to L forearm.

## 2023-08-02 NOTE — ED Provider Notes (Signed)
 [place] EMERGENCY DEPARTMENT AT [facility] Provider Note   CSN: [id number] Arrival date & time: 08/02/23  1405     History  Chief Complaint  Patient presents with   Sarah Fowler    Sarah Fowler is a 63 y.o. female history of migraines, here presenting with fall.  Patient states that she was getting out of her husband's truck and stood on the stool and the stool fell over and she hit her head on the metal table.  Patient also landed on the right elbow as well.  Patient was noted to have bleeding of the right scalp wound.  Also has tenderness of the right elbow.  Denies passing out or vomiting.  Tetanus is up-to-date.  The history is provided by the patient.       Home Medications Prior to Admission medications   Medication Sig Start Date End Date Taking? Authorizing Provider  Calcium  Carb-Cholecalciferol  (CALCIUM  600-D PO) Take 2 tablets by mouth daily.    [provider]  cephALEXin  (KEFLEX ) 500 MG capsule Take 500 mg by mouth 4 (four) times daily. 06/26/22   [provider]  estradiol  (ESTRACE ) 0.1 MG/GM vaginal cream Use 1/2 gram vaginally at bedtime three times per week. 07/09/22   Greta Leatherwood, MD  gabapentin  (NEURONTIN ) 300 MG capsule Take 600 mg by mouth at bedtime.     [provider]  ibuprofen  (ADVIL ) 800 MG tablet Take 1 tablet (800 mg total) by mouth every 8 (eight) hours as needed. 09/19/20   Greta Leatherwood, MD  levETIRAcetam  (KEPPRA  XR) 500 MG 24 hr tablet Take by mouth. 08/26/20   [provider]  montelukast  (SINGULAIR ) 10 MG tablet Take 10 mg by mouth at bedtime.  09/18/14   [provider]  Multiple Vitamins-Minerals (MULTIVITAMIN PO) Take 1 tablet by mouth daily.     [provider]  naproxen (NAPROSYN) 500 MG tablet Take 500 mg by mouth 2 (two) times daily. 04/13/19   [provider]  promethazine  (PHENERGAN ) 25 MG tablet Take by mouth. 07/02/18   [provider]   sertraline  (ZOLOFT ) 100 MG tablet Take by mouth. 11/25/19   [provider]  vitamin B-12 (CYANOCOBALAMIN) 100 MCG tablet Take by mouth.    [provider]  zolmitriptan (ZOMIG) 5 MG tablet Take 5 mg by mouth as needed for migraine (q2gr not to exceed 10mg  daily).     [provider]      Allergies    Chlorhexidine , Aspirin, Chocolate, Cinnamon, Depakote [valproic acid], Flexeril [cyclobenzaprine], Lactose intolerance (gi), Morphine and codeine, Other, Percocet [oxycodone -acetaminophen ], Sugar-protein-starch, Tape, and Prednisone     Review of Systems   Review of Systems  Musculoskeletal:        Right elbow pain  Skin:  Positive for wound.  All other systems reviewed and are negative.   Physical Exam Updated Vital Signs BP (!) 172/84 (BP Location: Left Arm)   Pulse 82   Temp 98.1 F (36.7 C)   Resp 19   LMP 08/31/2004   SpO2 99%  Physical Exam Vitals and nursing note reviewed.  Constitutional:      Appearance: Normal appearance.  HENT:     Head: Normocephalic.     Comments: 2 cm laceration of the right scalp    Nose: Nose normal.     Mouth/Throat:     Mouth: Mucous membranes are moist.  Eyes:     Extraocular Movements: Extraocular movements intact.     Pupils:  Pupils are equal, round, and reactive to light.  Cardiovascular:     Rate and Rhythm: Normal rate and regular rhythm.     Pulses: Normal pulses.     Heart sounds: Normal heart sounds.  Pulmonary:     Effort: Pulmonary effort is normal.     Breath sounds: Normal breath sounds.  Abdominal:     General: Abdomen is flat.     Palpations: Abdomen is soft.  Musculoskeletal:     Cervical back: Normal range of motion and neck supple.     Comments: Right elbow with swelling over the olecranon.  Decreased range of motion due to pain  Skin:    General: Skin is warm.     Capillary Refill: Capillary refill takes less than 2 seconds.  Neurological:     General: No focal deficit present.      Mental Status: She is alert and oriented to person, place, and time.  Psychiatric:        Mood and Affect: Mood normal.        Behavior: Behavior normal.     ED Results / Procedures / Treatments   Labs (all labs ordered are listed, but only abnormal results are displayed) Labs Reviewed - No data to display  EKG None  Radiology CT Cervical Spine Wo Contrast Result Date: 08/02/2023 CLINICAL DATA:  Neck trauma. Fall stepping off tailgate of truck. Trauma to back of head. EXAM: CT CERVICAL SPINE WITHOUT CONTRAST TECHNIQUE: Multidetector CT imaging of the cervical spine was performed without intravenous contrast. Multiplanar CT image reconstructions were also generated. RADIATION DOSE REDUCTION: This exam was performed according to the departmental dose-optimization program which includes automated exposure control, adjustment of the mA and/or kV according to patient size and/or use of iterative reconstruction technique. COMPARISON:  None Available. FINDINGS: Alignment: Grade 1 degenerative anterolisthesis at C3-4 measures 3 mm. No other significant listhesis is present. Straightening and slight reversal of the normal cervical lordosis is present. Skull base and vertebrae: The craniocervical junction is normal. Vertebral body heights are maintained. No acute fractures are present. Soft tissues and spinal canal: No prevertebral fluid or swelling. No visible canal hematoma. Disc levels: Uncovering of broad-based disc is associated with the anterolisthesis at C3-4. Mild central and bilateral foraminal stenosis is present. Uncovertebral spurring leads to moderate foraminal stenosis bilaterally, right greater than left at C5-6. Upper chest: The lung apices are clear. Other: Suboccipital scalp laceration and hematoma is present. No associated fracture is present. IMPRESSION: 1. No acute fracture or traumatic subluxation. 2. Suboccipital scalp laceration and hematoma without associated fracture. 3. Grade 1  degenerative anterolisthesis at C3-4 with mild central and bilateral foraminal stenosis. 4. Moderate foraminal stenosis bilaterally, right greater than left at C5-6. Electronically Signed   By: Audree Leas M.D.   On: 08/02/2023 15:48   CT Head Wo Contrast Result Date: 08/02/2023 CLINICAL DATA:  Fall stepping off the tailgate of truck. Trauma to back of head. Scalp hemorrhage. No loss of consciousness. EXAM: CT HEAD WITHOUT CONTRAST TECHNIQUE: Contiguous axial images were obtained from the base of the skull through the vertex without intravenous contrast. RADIATION DOSE REDUCTION: This exam was performed according to the departmental dose-optimization program which includes automated exposure control, adjustment of the mA and/or kV according to patient size and/or use of iterative reconstruction technique. COMPARISON:  CT head without contrast 05/17/2017. MR head without and with contrast 04/12/2015. FINDINGS: Brain: No acute infarct, hemorrhage, or mass lesion is present. The ventricles are of normal size.  No significant white matter lesions are present. Deep brain nuclei are within normal limits. The brainstem and cerebellum are within normal limits. Midline structures are within normal limits. Vascular: No hyperdense vessel or unexpected calcification. Skull: A suboccipital scalp laceration and hematoma is present. No subjacent fracture is present. Scalp is otherwise within normal limits. Calvarium is intact. No focal lytic or blastic lesions are present. Sinuses/Orbits: Chronic right maxillary sinus disease is present. A fluid level is present. Chronic wall thickening is noted. Right maxillary antrostomy is noted. The paranasal sinuses and mastoid air cells are otherwise clear. Bilateral lens replacements are noted. Globes and orbits are otherwise unremarkable. IMPRESSION: 1. Suboccipital scalp laceration and hematoma without subjacent fracture. 2. Normal CT appearance of the brain. 3. Chronic right  maxillary sinus disease. Electronically Signed   By: Audree Leas M.D.   On: 08/02/2023 15:44   DG Elbow Complete Right Result Date: 08/02/2023 CLINICAL DATA:  Pain after fall EXAM: RIGHT ELBOW - COMPLETE 4 VIEW COMPARISON:  None Available. FINDINGS: Joint effusion identified on lateral view. There is a subtle nondisplaced fracture of the base of the olecranon. Dorsal soft tissue swelling as well. No dislocation. Preserved bone mineralization. IMPRESSION: Nondisplaced fracture of the base of the olecranon with soft tissue swelling and joint effusion. Electronically Signed   By: Adrianna Horde M.D.   On: 08/02/2023 15:15    Procedures Procedures    LACERATION REPAIR Performed by: Florette Hurry Authorized by: Florette Hurry Consent: Verbal consent obtained. Risks and benefits: risks, benefits and alternatives were discussed Consent given by: patient Patient identity confirmed: provided demographic data Prepped and Draped in normal sterile fashion Wound explored  Laceration Location: R scalp  Laceration Length: 2cm  No Foreign Bodies seen or palpated  Anesthesia: local infiltration  Local anesthetic: none  Irrigation method: syringe Amount of cleaning: standard  Skin closure: staples  Number of staples: 2   Patient tolerance: Patient tolerated the procedure well with no immediate complications.   Medications Ordered in ED Medications  HYDROcodone -acetaminophen  (NORCO/VICODIN) 5-325 MG per tablet 2 tablet (has no administration in time range)    ED Course/ Medical Decision Making/ A&P                                 Medical Decision Making Africa Talk is a 63 y.o. female here presenting with fall.  Patient has right scalp laceration and hit her head.  Patient also has injury to the right elbow.  Plan to get CT head and cervical spine and also x-ray of the right elbow  6:36 PM CT showed no fracture.  Patient does have a right olecranon nondisplaced fracture.   Posterior elbow splint was placed and sling was given.  Problems Addressed: Closed fracture of olecranon process of right ulna, initial encounter: acute illness or injury Scalp laceration, initial encounter: acute illness or injury  Amount and/or Complexity of Data Reviewed Radiology: ordered and independent interpretation performed. Decision-making details documented in ED Course.  Risk Prescription drug management.   Final Clinical Impression(s) / ED Diagnoses Final diagnoses:  None    Rx / DC Orders ED Discharge Orders     None         Dalene Duck, MD 08/02/23 [phone]

## 2023-08-02 NOTE — Progress Notes (Signed)
 Orthopedic Tech Progress Note Patient Details:  Sarah Fowler 1961-01-25 [id number]  Ortho Devices Type of Ortho Device: Shoulder immobilizer, Post (long) splint Splint Material: Fiberglass Ortho Device/Splint Location: RUE Ortho Device/Splint Interventions: Ordered, Application, Adjustment   Post Interventions Patient Tolerated: Well Instructions Provided: Care of device  Herbie Loll 08/02/2023, 7:10 PM

## 2023-08-06 ENCOUNTER — Ambulatory Visit (INDEPENDENT_AMBULATORY_CARE_PROVIDER_SITE_OTHER): Admitting: Student

## 2023-08-06 ENCOUNTER — Encounter (HOSPITAL_BASED_OUTPATIENT_CLINIC_OR_DEPARTMENT_OTHER): Payer: Self-pay | Admitting: Student

## 2023-08-06 DIAGNOSIS — S52024A Nondisplaced fracture of olecranon process without intraarticular extension of right ulna, initial encounter for closed fracture: Secondary | ICD-10-CM
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
## 2023-08-27 ENCOUNTER — Ambulatory Visit (HOSPITAL_BASED_OUTPATIENT_CLINIC_OR_DEPARTMENT_OTHER): Admitting: Student

## 2023-08-27 ENCOUNTER — Encounter (HOSPITAL_BASED_OUTPATIENT_CLINIC_OR_DEPARTMENT_OTHER): Payer: Self-pay | Admitting: Student

## 2023-08-27 ENCOUNTER — Ambulatory Visit (INDEPENDENT_AMBULATORY_CARE_PROVIDER_SITE_OTHER)

## 2023-08-27 DIAGNOSIS — S52024A Nondisplaced fracture of olecranon process without intraarticular extension of right ulna, initial encounter for closed fracture: Secondary | ICD-10-CM
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
## 2023-09-17 ENCOUNTER — Ambulatory Visit (HOSPITAL_BASED_OUTPATIENT_CLINIC_OR_DEPARTMENT_OTHER)

## 2023-09-17 ENCOUNTER — Encounter (HOSPITAL_BASED_OUTPATIENT_CLINIC_OR_DEPARTMENT_OTHER): Payer: Self-pay | Admitting: Student

## 2023-09-17 ENCOUNTER — Ambulatory Visit (INDEPENDENT_AMBULATORY_CARE_PROVIDER_SITE_OTHER): Admitting: Student

## 2023-09-17 DIAGNOSIS — S52034A Nondisplaced fracture of olecranon process with intraarticular extension of right ulna, initial encounter for closed fracture: Secondary | ICD-10-CM

## 2023-09-17 DIAGNOSIS — S52024A Nondisplaced fracture of olecranon process without intraarticular extension of right ulna, initial encounter for closed fracture: Secondary | ICD-10-CM
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
## 2023-10-08 ENCOUNTER — Ambulatory Visit (HOSPITAL_BASED_OUTPATIENT_CLINIC_OR_DEPARTMENT_OTHER)

## 2023-10-08 ENCOUNTER — Ambulatory Visit (HOSPITAL_BASED_OUTPATIENT_CLINIC_OR_DEPARTMENT_OTHER): Admitting: Student

## 2023-10-08 DIAGNOSIS — S52024A Nondisplaced fracture of olecranon process without intraarticular extension of right ulna, initial encounter for closed fracture: Secondary | ICD-10-CM
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
## 2023-11-18 ENCOUNTER — Encounter (HOSPITAL_BASED_OUTPATIENT_CLINIC_OR_DEPARTMENT_OTHER): Payer: Self-pay

## 2023-11-25 ENCOUNTER — Ambulatory Visit (HOSPITAL_BASED_OUTPATIENT_CLINIC_OR_DEPARTMENT_OTHER): Admitting: Student

## 2023-11-28 ENCOUNTER — Ambulatory Visit (HOSPITAL_BASED_OUTPATIENT_CLINIC_OR_DEPARTMENT_OTHER): Admitting: Physician Assistant

## 2023-12-23 ENCOUNTER — Ambulatory Visit (INDEPENDENT_AMBULATORY_CARE_PROVIDER_SITE_OTHER): Admitting: Obstetrics and Gynecology

## 2023-12-23 ENCOUNTER — Encounter: Payer: Self-pay | Admitting: Obstetrics and Gynecology

## 2023-12-23 VITALS — BP 124/82 | HR 78 | Ht 65.25 in | Wt 124.0 lb

## 2023-12-23 DIAGNOSIS — N952 Postmenopausal atrophic vaginitis: Secondary | ICD-10-CM | POA: Diagnosis not present

## 2023-12-23 DIAGNOSIS — Z9189 Other specified personal risk factors, not elsewhere classified: Secondary | ICD-10-CM | POA: Diagnosis not present

## 2023-12-23 DIAGNOSIS — M810 Age-related osteoporosis without current pathological fracture: Secondary | ICD-10-CM | POA: Diagnosis not present

## 2023-12-23 DIAGNOSIS — Z01419 Encounter for gynecological examination (general) (routine) without abnormal findings: Secondary | ICD-10-CM

## 2023-12-23 MED ORDER — ESTRADIOL 10 MCG VA TABS: 1.0000 | ORAL_TABLET | VAGINAL | 3 refills | Status: AC

## 2023-12-23 NOTE — Patient Instructions (Signed)
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
## 2023-12-24 ENCOUNTER — Encounter: Payer: Self-pay | Admitting: Obstetrics and Gynecology

## 2023-12-26 ENCOUNTER — Ambulatory Visit: Payer: Self-pay | Admitting: Obstetrics and Gynecology

## 2024-02-03 ENCOUNTER — Encounter: Payer: Self-pay | Admitting: Radiology

## 2024-02-05 ENCOUNTER — Ambulatory Visit: Admitting: Obstetrics and Gynecology

## 2024-02-24 LAB — HM MAMMOGRAPHY

## 2024-02-25 ENCOUNTER — Ambulatory Visit: Payer: Self-pay | Admitting: Obstetrics and Gynecology

## 2024-02-25 ENCOUNTER — Encounter: Payer: Self-pay | Admitting: Obstetrics and Gynecology

## 2024-12-23 ENCOUNTER — Encounter: Admitting: Obstetrics and Gynecology
